# Patient Record
Sex: Female | Born: 2000
Health system: Southern US, Community
[De-identification: ages and names within clinical notes are randomized; demographics above are authoritative.]

## PROBLEM LIST (undated history)

## (undated) DIAGNOSIS — H04559 Acquired stenosis of unspecified nasolacrimal duct: Secondary | ICD-10-CM

## (undated) DIAGNOSIS — D573 Sickle-cell trait: Secondary | ICD-10-CM

## (undated) DIAGNOSIS — L309 Dermatitis, unspecified: Secondary | ICD-10-CM

## (undated) HISTORY — DX: Acquired stenosis of unspecified nasolacrimal duct: H04.559

## (undated) HISTORY — DX: Dermatitis, unspecified: L30.9

## (undated) HISTORY — DX: Sickle-cell trait: D57.3

---
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
## 2022-01-26 DIAGNOSIS — Z309 Encounter for contraceptive management, unspecified: Secondary | ICD-10-CM | POA: Diagnosis not present

## 2022-01-26 DIAGNOSIS — N898 Other specified noninflammatory disorders of vagina: Secondary | ICD-10-CM | POA: Diagnosis not present

## 2022-02-03 DIAGNOSIS — Z111 Encounter for screening for respiratory tuberculosis: Secondary | ICD-10-CM | POA: Diagnosis not present

## 2022-03-19 ENCOUNTER — Telehealth: Payer: Self-pay

## 2022-03-19 MED ORDER — EPINEPHRINE 0.3 MG/0.3ML IJ SOAJ: 0.3000 mg | INTRAMUSCULAR | 6 refills | Status: AC | PRN

## 2022-03-19 NOTE — Telephone Encounter (Signed)
EpiPen refills sent to preferred pharmacy.

## 2022-03-19 NOTE — Telephone Encounter (Signed)
Mother called and asked for a refill of her epi-pen. Called into the [facility] [address], [place], [place] [address].   Explained that it has been over a year from the last wellness check up offered to schedule an appointment. 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

## 2022-05-11 ENCOUNTER — Encounter: Payer: Self-pay | Admitting: Internal Medicine

## 2022-05-11 ENCOUNTER — Ambulatory Visit: Payer: Medicaid Other | Attending: Internal Medicine | Admitting: Internal Medicine

## 2022-05-11 VITALS — BP 120/54 | HR 77 | Ht 63.0 in | Wt 117.0 lb

## 2022-05-11 DIAGNOSIS — R002 Palpitations: Secondary | ICD-10-CM

## 2022-05-11 NOTE — Progress Notes (Signed)
Cardiology Office Note:    Date:  05/11/2022   ID:  Holly Goodman, DOB 2001-02-23, MRN [id number]  PCP:  [name], NP   [facility] Cardiologist:  Janina Mayo, MD     Referring MD: Tyson Dense, *   No chief complaint on file. Palpitations  History of Present Illness:    Holly Goodman is a 21 y.o. female with a hx of palpitations reported in 2020 at [facility], noted after smoking Marijuana. She did not require further w/u. TSH elevated 3 years ago at that time. Now normal. Notes some atypical Cp/SOB noted all day one day. Woke up with palpitations. She feels better now. No syncope.  No family hx of cardiac dx. No prior cardiac dx.  No URI.   Past Medical History:  Diagnosis Date   Lacrimal duct stenosis [id number]   Sickle cell trait ([place])     No past surgical history on file.  Current Medications: Current Meds  Medication Sig   AUROVELA 1.5/30 1.5-30 MG-MCG tablet TAKE 1 TABLET BY MOUTH DAILY. DISCARD PLACEBO TABLETS FOR CONTINUOUS CYCLING   EPINEPHrine (EPIPEN 2-PAK) 0.3 mg/0.3 mL IJ SOAJ injection Inject 0.3 mg into the muscle as needed for anaphylaxis.   fluconazole (DIFLUCAN) 150 MG tablet Take 1 tablet today and 1 tablet 3 days from now   Norethindrone Acet-Ethinyl Est (AUROVELA 1.5/30 PO) Take by mouth daily.   Vitamin D, Ergocalciferol, (DRISDOL) 1.25 MG (50000 UNIT) CAPS capsule TAKE 1 CAPSULE BY MOUTH EVERY 7 DAYS     Allergies:   Pineapple, Other, Latex, and Wool alcohol [lanolin]   Social History   Socioeconomic History   Marital status: Single    Spouse name: Not on file   Number of children: Not on file   Years of education: Not on file   Highest education level: Not on file  Occupational History   Not on file  Tobacco Use   Smoking status: Never   Smokeless tobacco: Never  Vaping Use   Vaping Use: Never used  Substance and Sexual Activity   Alcohol use: No   Drug use: No   Sexual activity: Not Currently    Birth  control/protection: Abstinence, Condom, Pill  Other Topics Concern   Not on file  Social History Narrative   12th grade [employer]      Social Determinants of Health   Financial Resource Strain: Low Risk  (08/26/2018)   Overall Financial Resource Strain (CARDIA)    Difficulty of Paying Living Expenses: Not hard at all  Food Insecurity: Unknown (08/26/2018)   Hunger Vital Sign    Worried About Running Out of Food in the Last Year: Patient refused    Ran Out of Food in the Last Year: Patient refused  Transportation Needs: Unknown (08/26/2018)   PRAPARE - Hydrologist (Medical): Patient refused    Lack of Transportation (Non-Medical): Patient refused  Physical Activity: Not on file  Stress: Not on file  Social Connections: Not on file     Family History: The patient's family history includes Cancer in her maternal grandfather; Diabetes in her maternal grandfather; Hearing loss in her maternal grandfather; Hyperlipidemia in her maternal grandmother; Hypertension in her maternal grandfather and maternal grandmother; Miscarriages / [place] in her mother; Sickle cell trait in her mother; Thyroid disease in her maternal aunt. There is no history of Alcohol abuse, Arthritis, Asthma, Birth defects, COPD, Depression, Drug abuse, Early  death, Heart disease, Kidney disease, Learning disabilities, Mental illness, Mental retardation, Stroke, Vision loss, or Varicose Veins.  ROS:   Please see the history of present illness.     All other systems reviewed and are negative.  EKGs/Labs/Other Studies Reviewed:    The following studies were reviewed today:   EKG:  EKG is  ordered today.  The ekg ordered today demonstrates   11/6- NSR with sinus arrhythmia  Recent Labs: 06/17/2021: ALT 29; BUN 8; Creat 0.86; Hemoglobin 12.8; Platelets 400; Potassium 4.2; Sodium 137; TSH 1.60  Recent Lipid Panel No results found for: "CHOL", "TRIG", "HDL",  "CHOLHDL", "VLDL", "LDLCALC", "LDLDIRECT"   Risk Assessment/Calculations:    Physical Exam:    VS:    Vitals:   05/11/22 1540  BP: (!) 120/54  Pulse: 77     Wt Readings from Last 3 Encounters:  05/11/22 117 lb (53.1 kg) (31 %, Z= -0.50)*  07/14/21 113 lb 9.6 oz (51.5 kg) (27 %, Z= -0.61)*  06/17/21 113 lb (51.3 kg) (26 %, Z= -0.64)*   * Growth percentiles are based on CDC (Girls, 2-20 Years) data.     GEN:  Well nourished, well developed in no acute distress HEENT: Normal NECK: No JVD; No carotid bruits LYMPHATICS: No lymphadenopathy CARDIAC: RRR, no murmurs, rubs, gallops RESPIRATORY:  Clear to auscultation without rales, wheezing or rhonchi  ABDOMEN: Soft, non-tender, non-distended MUSCULOSKELETAL:  No edema; No deformity  SKIN: Warm and dry NEUROLOGIC:  Alert and oriented x 3 PSYCHIATRIC:  Normal affect   ASSESSMENT:    Palpitations: She does not have high risk features including syncope c/f arrhythmia , family hx of SCD, or abnormalities on her EKG. Sinus arrhythmia is benign. Can get a TTE to r/o structural heart disease and clear for sports. Although, no signs of ecg.   PLAN:    In order of problems listed above:  TTE If normal, no further w/u           Medication Adjustments/Labs and Tests Ordered: Current medicines are reviewed at length with the patient today.  Concerns regarding medicines are outlined above.  Orders Placed This Encounter  Procedures   ECHOCARDIOGRAM COMPLETE   No orders of the defined types were placed in this encounter.   Patient Instructions  Medication Instructions:  No Changes In Medications at this time.  *If you need a refill on your cardiac medications before your next appointment, please call your pharmacy*  Lab Work: None Ordered At This Time.  If you have labs (blood work) drawn today and your tests are completely normal, you will receive your results only by: MyChart Message (if you have MyChart) OR A paper  copy in the mail If you have any lab test that is abnormal or we need to change your treatment, we will call you to review the results.  Testing/Procedures: Your physician has requested that you have an echocardiogram. Echocardiography is a painless test that uses sound waves to create images of your heart. It provides your doctor with information about the size and shape of your heart and how well your heart's chambers and valves are working. You may receive an ultrasound enhancing agent through an IV if needed to better visualize your heart during the echo.This procedure takes approximately one hour. There are no restrictions for this procedure. This will take place at the [address].   Follow-Up: At [facility], you and your health needs are our priority.  As part of  our continuing mission to provide you with exceptional heart care, we have created designated Provider Care Teams.  These Care Teams include your primary Cardiologist (physician) and Advanced Practice Providers (APPs -  Physician Assistants and Nurse Practitioners) who all work together to provide you with the care you need, when you need it.  Your next appointment:   AS NEEDED   The format for your next appointment:   In Person  Provider:   Maisie Fus, MD           Signed, Maisie Fus, MD  05/11/2022 4:06 PM    [facility]

## 2022-05-11 NOTE — Patient Instructions (Signed)
Medication Instructions:  No Changes In Medications at this time.  *If you need a refill on your cardiac medications before your next appointment, please call your pharmacy*  Lab Work: None Ordered At This Time.  If you have labs (blood work) drawn today and your tests are completely normal, you will receive your results only by: MyChart Message (if you have MyChart) OR A paper copy in the mail If you have any lab test that is abnormal or we need to change your treatment, we will call you to review the results.  Testing/Procedures: Your physician has requested that you have an echocardiogram. Echocardiography is a painless test that uses sound waves to create images of your heart. It provides your doctor with information about the size and shape of your heart and how well your heart's chambers and valves are working. You may receive an ultrasound enhancing agent through an IV if needed to better visualize your heart during the echo.This procedure takes approximately one hour. There are no restrictions for this procedure. This will take place at the [address].   Follow-Up: At [facility], you and your health needs are our priority.  As part of our continuing mission to provide you with exceptional heart care, we have created designated Provider Care Teams.  These Care Teams include your primary Cardiologist (physician) and Advanced Practice Providers (APPs -  Physician Assistants and Nurse Practitioners) who all work together to provide you with the care you need, when you need it.  Your next appointment:   AS NEEDED   The format for your next appointment:   In Person  Provider:   Branch, Mary E, MD       

## 2022-05-12 ENCOUNTER — Ambulatory Visit (HOSPITAL_COMMUNITY): Payer: Medicaid Other | Attending: Internal Medicine

## 2022-05-12 ENCOUNTER — Encounter: Payer: Self-pay | Admitting: Internal Medicine

## 2022-05-12 DIAGNOSIS — R002 Palpitations: Secondary | ICD-10-CM | POA: Diagnosis not present

## 2022-05-12 DIAGNOSIS — I340 Nonrheumatic mitral (valve) insufficiency: Secondary | ICD-10-CM

## 2022-05-12 LAB — ECHOCARDIOGRAM COMPLETE
Area-P 1/2: 3.85 cm2
MV M vel: 5.04 m/s
MV Peak grad: 101.4 mmHg
S' Lateral: 3.3 cm

## 2022-05-27 DIAGNOSIS — N760 Acute vaginitis: Secondary | ICD-10-CM | POA: Diagnosis not present

## 2022-09-04 DIAGNOSIS — F419 Anxiety disorder, unspecified: Secondary | ICD-10-CM | POA: Diagnosis not present

## 2022-09-04 DIAGNOSIS — N898 Other specified noninflammatory disorders of vagina: Secondary | ICD-10-CM | POA: Diagnosis not present

## 2022-09-04 DIAGNOSIS — L292 Pruritus vulvae: Secondary | ICD-10-CM | POA: Diagnosis not present

## 2022-09-04 DIAGNOSIS — N760 Acute vaginitis: Secondary | ICD-10-CM | POA: Diagnosis not present

## 2022-11-16 DIAGNOSIS — N898 Other specified noninflammatory disorders of vagina: Secondary | ICD-10-CM | POA: Diagnosis not present

## 2022-12-02 DIAGNOSIS — R809 Proteinuria, unspecified: Secondary | ICD-10-CM | POA: Insufficient documentation

## 2022-12-02 DIAGNOSIS — N898 Other specified noninflammatory disorders of vagina: Secondary | ICD-10-CM | POA: Diagnosis not present

## 2022-12-02 DIAGNOSIS — R82998 Other abnormal findings in urine: Secondary | ICD-10-CM | POA: Insufficient documentation

## 2022-12-02 DIAGNOSIS — R895 Abnormal microbiological findings in specimens from other organs, systems and tissues: Secondary | ICD-10-CM | POA: Diagnosis not present

## 2022-12-18 DIAGNOSIS — Z9109 Other allergy status, other than to drugs and biological substances: Secondary | ICD-10-CM | POA: Diagnosis not present

## 2022-12-18 DIAGNOSIS — A749 Chlamydial infection, unspecified: Secondary | ICD-10-CM | POA: Diagnosis not present

## 2022-12-18 DIAGNOSIS — L292 Pruritus vulvae: Secondary | ICD-10-CM | POA: Diagnosis not present

## 2022-12-31 ENCOUNTER — Ambulatory Visit (INDEPENDENT_AMBULATORY_CARE_PROVIDER_SITE_OTHER): Payer: Medicaid Other | Admitting: Pediatrics

## 2022-12-31 ENCOUNTER — Encounter: Payer: Self-pay | Admitting: Pediatrics

## 2022-12-31 VITALS — BP 98/64 | Ht 63.8 in | Wt 117.3 lb

## 2022-12-31 DIAGNOSIS — Z0001 Encounter for general adult medical examination with abnormal findings: Secondary | ICD-10-CM | POA: Diagnosis not present

## 2022-12-31 DIAGNOSIS — Z68.41 Body mass index (BMI) pediatric, 5th percentile to less than 85th percentile for age: Secondary | ICD-10-CM

## 2022-12-31 DIAGNOSIS — J351 Hypertrophy of tonsils: Secondary | ICD-10-CM

## 2022-12-31 DIAGNOSIS — Z00129 Encounter for routine child health examination without abnormal findings: Secondary | ICD-10-CM

## 2022-12-31 DIAGNOSIS — Z23 Encounter for immunization: Secondary | ICD-10-CM | POA: Diagnosis not present

## 2022-12-31 MED ORDER — MUPIROCIN 2 % EX OINT: 1.0000 | TOPICAL_OINTMENT | Freq: Two times a day (BID) | CUTANEOUS | 3 refills | Status: AC

## 2022-12-31 MED ORDER — TRIAMCINOLONE ACETONIDE 0.5 % EX CREA: 1.0000 | TOPICAL_CREAM | Freq: Three times a day (TID) | CUTANEOUS | 1 refills | Status: AC

## 2022-12-31 NOTE — Progress Notes (Signed)
Subjective:     Holly Goodman is a 22 y.o. female and is here for a comprehensive physical exam. The patient reports problems - tonsils get infected very very fast . -eczema flair -impetigo on the nose- will happen after a lot of nose blowing, will get blisters Social History   Socioeconomic History   Marital status: Single    Spouse name: Not on file   Number of children: Not on file   Years of education: Not on file   Highest education level: Not on file  Occupational History   Not on file  Tobacco Use   Smoking status: Never   Smokeless tobacco: Never  Vaping Use   Vaping Use: Never used  Substance and Sexual Activity   Alcohol use: No   Drug use: No   Sexual activity: Not Currently    Birth control/protection: Abstinence, Condom, Pill  Other Topics Concern   Not on file  Social History Narrative   12th grade [employer]      Social Determinants of Health   Financial Resource Strain: Low Risk  (08/26/2018)   Overall Financial Resource Strain (CARDIA)    Difficulty of Paying Living Expenses: Not hard at all  Food Insecurity: Unknown (08/26/2018)   Hunger Vital Sign    Worried About Running Out of Food in the Last Year: Patient declined    Ran Out of Food in the Last Year: Patient declined  Transportation Needs: Unknown (08/26/2018)   PRAPARE - Administrator, Civil Service (Medical): Patient declined    Lack of Transportation (Non-Medical): Patient declined  Physical Activity: Not on file  Stress: Not on file  Social Connections: Not on file  Intimate Partner Violence: Not on file   Health Maintenance  Topic Date Due   COVID-19 Vaccine (1) Never done   HPV VACCINES (1 - 2-dose series) Never done   Meningococcal B Vaccine  Never done   Hepatitis C Screening  Never done   INFLUENZA VACCINE  02/04/2023   DTaP/Tdap/Td (7 - Td or Tdap) 09/17/2025   HIV Screening  Completed    The following portions of the patient's history were  reviewed and updated as appropriate: allergies, current medications, past family history, past medical history, past social history, past surgical history, and problem list.  Review of Systems Pertinent items are noted in HPI.   Objective:    BP 98/64   Ht 5' 3.8" (1.621 m)   Wt 117 lb 4.8 oz (53.2 kg)   BMI 20.26 kg/m  General appearance: alert, cooperative, appears stated age, and no distress Head: Normocephalic, without obvious abnormality, atraumatic Eyes: conjunctivae/corneas clear. PERRL, EOM's intact. Fundi benign. Ears: normal TM's and external ear canals both ears Nose: Nares normal. Septum midline. Mucosa normal. No drainage or sinus tenderness. Throat: lips, mucosa, and tongue normal; teeth and gums normal and bilateral tonsils enlarged Neck: no adenopathy, no carotid bruit, no JVD, supple, symmetrical, trachea midline, and thyroid not enlarged, symmetric, no tenderness/mass/nodules Lungs: clear to auscultation bilaterally Heart: regular rate and rhythm, S1, S2 normal, no murmur, click, rub or gallop Abdomen: soft, non-tender; bowel sounds normal; no masses,  no organomegaly Extremities: extremities normal, atraumatic, no cyanosis or edema Pulses: 2+ and symmetric Skin: Skin color, texture, turgor normal. No rashes or lesions Lymph nodes: Cervical, supraclavicular, and axillary nodes normal. Neurologic: Grossly normal    Assessment:    Healthy female exam.  Tonsillar hypertrophy eczema     Plan:  Referred to ENT for tonsillar hypertrophy Triamcinolone for eczema flares Mupirocin ointment for impetigo flares MenB vaccine per orders. Indications, contraindications and side effects of vaccine/vaccines discussed with parent and parent verbally expressed understanding and also agreed with the administration of vaccine/vaccines as ordered above today.Handout (VIS) given for each vaccine at this visit. See After Visit Summary for Counseling Recommendations

## 2023-01-01 ENCOUNTER — Telehealth: Payer: Self-pay | Admitting: Pediatrics

## 2023-01-01 ENCOUNTER — Encounter: Payer: Self-pay | Admitting: Pediatrics

## 2023-01-01 DIAGNOSIS — J351 Hypertrophy of tonsils: Secondary | ICD-10-CM | POA: Insufficient documentation

## 2023-01-01 NOTE — Patient Instructions (Signed)
At [facility] we value your feedback. You may receive a survey about your visit today. Please share your experience as we strive to create trusting relationships with our patients to provide genuine, compassionate, quality care.   Preventive Care 18-21 Years Old, Female Preventive care refers to lifestyle choices and visits with your health care provider that can promote health and wellness. At this stage in your life, you may start seeing a primary care physician instead of a pediatrician for your preventive care. Preventive care visits are also called wellness exams. What can I expect for my preventive care visit? Counseling During your preventive care visit, your health care provider may ask about your: Medical history, including: Past medical problems. Family medical history. Pregnancy history. Current health, including: Menstrual cycle. Method of birth control. Emotional well-being. Home life and relationship well-being. Sexual activity and sexual health. Lifestyle, including: Alcohol, nicotine or tobacco, and drug use. Access to firearms. Diet, exercise, and sleep habits. Sunscreen use. Motor vehicle safety. Physical exam Your health care provider may check your: Height and weight. These may be used to calculate your BMI (body mass index). BMI is a measurement that tells if you are at a healthy weight. Waist circumference. This measures the distance around your waistline. This measurement also tells if you are at a healthy weight and may help predict your risk of certain diseases, such as type 2 diabetes and high blood pressure. Heart rate and blood pressure. Body temperature. Skin for abnormal spots. Breasts. What immunizations do I need? Vaccines are usually given at various ages, according to a schedule. Your health care provider will recommend vaccines for you based on your age, medical history, and lifestyle or other factors, such as travel or where you  work. What tests do I need? Screening Your health care provider may recommend screening tests for certain conditions. This may include: Vision and hearing tests. Lipid and cholesterol levels. Pelvic exam and Pap test. Hepatitis B test. Hepatitis C test. HIV (human immunodeficiency virus) test. STI (sexually transmitted infection) testing, if you are at risk. Tuberculosis skin test if you have symptoms. BRCA-related cancer screening. This may be done if you have a family history of breast, ovarian, tubal, or peritoneal cancers. Talk with your health care provider about your test results, treatment options, and if necessary, the need for more tests. Follow these instructions at home: Eating and drinking Eat a healthy diet that includes fresh fruits and vegetables, whole grains, lean protein, and low-fat dairy products. Drink enough fluid to keep your urine pale yellow. Do not drink alcohol if: Your health care provider tells you not to drink. You are pregnant, may be pregnant, or are planning to become pregnant. You are under the legal drinking age. In the [place] the legal drinking age is 21. If you drink alcohol: Limit how much you have to 0-1 drink a day. Know how much alcohol is in your drink. In the [place] one drink equals one 12 oz bottle of beer (355 mL), one 5 oz glass of wine (148 mL), or one 1 oz glass of hard liquor (44 mL). Lifestyle Brush your teeth every morning and night with fluoride toothpaste. Floss one time each day. Exercise for at least 30 minutes 5 or more days of the week. Do not use any products that contain nicotine or tobacco. These products include cigarettes, chewing tobacco, and vaping devices, such as e-cigarettes. If you need help quitting, ask your health care provider. Do not use drugs. If you are   sexually active, practice safe sex. Use a condom or other form of protection to prevent STIs. If you do not wish to become pregnant, use a form of birth control.  If you plan to become pregnant, see your health care provider for a prepregnancy visit. Find healthy ways to manage stress, such as: Meditation, yoga, or listening to music. Journaling. Talking to a trusted person. Spending time with friends and family. Safety Always wear your seat belt while driving or riding in a vehicle. Do not drive: If you have been drinking alcohol. Do not ride with someone who has been drinking. When you are tired or distracted. While texting. If you have been using any mind-altering substances or drugs. Wear a helmet and other protective equipment during sports activities. If you have firearms in your house, make sure you follow all gun safety procedures. Seek help if you have been bullied, physically abused, or sexually abused. Use the internet responsibly to avoid dangers, such as online bullying and online sex predators. What's next? Go to your health care provider once a year for an annual wellness visit. Ask your health care provider how often you should have your eyes and teeth checked. Stay up to date on all vaccines. This information is not intended to replace advice given to you by your health care provider. Make sure you discuss any questions you have with your health care provider. Document Revised: 12/18/2020 Document Reviewed: 12/18/2020 Elsevier Patient Education  2024 [employer]vier Inc.  

## 2023-01-01 NOTE — Telephone Encounter (Signed)
Patient called requesting to speak with Aron Baba, CMA, regarding a vaccination that was given 6/27. Patient is experiencing swelling on her arm and underarm and is wondering what can be done to ease the pain. Patient would like to be called back at earliest convenience.    Holly Goodman [phone]

## 2023-01-06 NOTE — Telephone Encounter (Signed)
Patient is feeling better.

## 2023-02-01 ENCOUNTER — Ambulatory Visit
Admission: EM | Admit: 2023-02-01 | Discharge: 2023-02-01 | Disposition: A | Discharge status: Home / Self Care | Payer: Medicaid Other | Attending: Physician Assistant | Admitting: Physician Assistant

## 2023-02-01 DIAGNOSIS — D573 Sickle-cell trait: Secondary | ICD-10-CM | POA: Insufficient documentation

## 2023-02-01 DIAGNOSIS — N760 Acute vaginitis: Secondary | ICD-10-CM | POA: Diagnosis not present

## 2023-02-01 NOTE — ED Triage Notes (Signed)
"  I think I have BV". Vaginal discharge and itchiness. ? Concern for STI. No dysuria.

## 2023-02-01 NOTE — ED Provider Notes (Signed)
[name]    CSN: [id number] Arrival date & time: 02/01/23  1453      History   Chief Complaint Chief Complaint  Patient presents with   Vaginal Problem    HPI Holly Goodman is a 22 y.o. female.   Patient here today for evaluation of vaginal discharge and itchiness.  She denies any genital lesions or rashes.  She would like screening for BV and STDs.  She denies any abdominal pain, nausea, vomiting or back pain.  She has not had fever.  She denies any known STD exposure.  She declines blood work for STD screening.  The history is provided by the patient.    Past Medical History:  Diagnosis Date   Lacrimal duct stenosis [id number]   Mild persistent asthma with acute exacerbation 07/07/2012   Exacerbation off and on from 06/21/12 through 07/07/12   Sickle cell trait [facility])     Patient Active Problem List   Diagnosis Date Noted   Tonsillar hypertrophy 01/01/2023   Proteinuria 12/02/2022   Leukocytes in urine 12/02/2022   Encounter for well child check without abnormal findings 04/12/2017   BMI (body mass index), pediatric, 5% to less than 85% for age 40/02/2017    History reviewed. No pertinent surgical history.  OB History   No obstetric history on file.      Home Medications    Prior to Admission medications   Medication Sig Start Date End Date Taking? Authorizing Provider  norethindrone-ethinyl estradiol-iron (JUNEL FE 1.5/30) 1.5-30 MG-MCG tablet Take 1 tablet by mouth daily. 01/11/23  Yes [provider]  AUROVELA 1.5/30 1.5-30 MG-MCG tablet TAKE 1 TABLET BY MOUTH DAILY. DISCARD PLACEBO TABLETS FOR CONTINUOUS CYCLING 02/03/21   Alfonso Ramus T, FNP  clobetasol ointment (TEMOVATE) 0.05 % Apply 1 Application topically 2 (two) times daily.    [provider]  EPINEPHrine (EPIPEN 2-PAK) 0.3 mg/0.3 mL IJ SOAJ injection Inject 0.3 mg into the muscle as needed for anaphylaxis. 03/19/22   Klett, Pascal Lux, NP  JUNEL FE 1.5/30 1.5-30 MG-MCG  tablet Take 1 tablet by mouth daily. 10/20/22   [provider]  norethindrone-ethinyl estradiol-iron (HAILEY FE 1.5/30) 1.5-30 MG-MCG tablet Take 1 tablet by mouth daily.    [provider]  norethindrone-ethinyl estradiol-iron (JUNEL FE 1.5/30) 1.5-30 MG-MCG tablet Take 1 tablet by mouth daily.    [provider]  sertraline (ZOLOFT) 25 MG tablet Take 1 tablet by mouth daily.    [provider]    Family History Family History  Problem Relation Age of Onset   Hypertension Maternal Grandmother    Hyperlipidemia Maternal Grandmother    Diabetes Maternal Grandfather    Hypertension Maternal Grandfather    Cancer Maternal Grandfather        prostate   Hearing loss Maternal Freight forwarder exposure   Sickle cell trait Mother    Miscarriages / [place] Mother    Thyroid disease Maternal Aunt    Alcohol abuse Neg Hx    Arthritis Neg Hx    Asthma Neg Hx    Birth defects Neg Hx    COPD Neg Hx    Depression Neg Hx    Drug abuse Neg Hx    Early death Neg Hx    Heart disease Neg Hx    Kidney disease Neg Hx    Learning disabilities Neg Hx    Mental illness Neg Hx    Mental retardation Neg Hx  Stroke Neg Hx    Vision loss Neg Hx    Varicose Veins Neg Hx     Social History Social History   Tobacco Use   Smoking status: Never   Smokeless tobacco: Never  Vaping Use   Vaping status: Never Used  Substance Use Topics   Alcohol use: No   Drug use: No     Allergies   Pineapple, Other, Latex, and Wool alcohol [lanolin]   Review of Systems Review of Systems  Constitutional:  Negative for chills and fever.  Eyes:  Negative for discharge and redness.  Respiratory:  Negative for shortness of breath.   Gastrointestinal:  Negative for abdominal pain, nausea and vomiting.  Genitourinary:  Positive for vaginal discharge. Negative for flank pain and genital sores.  Musculoskeletal:  Negative for back pain.     Physical  Exam Triage Vital Signs ED Triage Vitals  Encounter Vitals Group     BP 02/01/23 1503 114/79     Systolic BP Percentile --      Diastolic BP Percentile --      Pulse Rate 02/01/23 1503 88     Resp 02/01/23 1503 16     Temp 02/01/23 1503 98.9 F (37.2 C)     Temp Source 02/01/23 1503 Oral     SpO2 02/01/23 1503 99 %     Weight 02/01/23 1501 119 lb (54 kg)     Height 02/01/23 1501 5\' 3"  (1.6 m)     Head Circumference --      Peak Flow --      Pain Score 02/01/23 1501 0     Pain Loc --      Pain Education --      Exclude from Growth Chart --    No data found.  Updated Vital Signs BP 114/79 (BP Location: Left Arm)   Pulse 88   Temp 98.9 F (37.2 C) (Oral)   Resp 16   Ht 5\' 3"  (1.6 m)   Wt 119 lb (54 kg)   SpO2 99%   BMI 21.08 kg/m      Physical Exam Vitals and nursing note reviewed.  Constitutional:      General: She is not in acute distress.    Appearance: Normal appearance. She is not ill-appearing.  HENT:     Head: Normocephalic and atraumatic.  Eyes:     Conjunctiva/sclera: Conjunctivae normal.  Cardiovascular:     Rate and Rhythm: Normal rate.  Pulmonary:     Effort: Pulmonary effort is normal. No respiratory distress.  Genitourinary:    Comments: Patient performed self swab Neurological:     Mental Status: She is alert.  Psychiatric:        Mood and Affect: Mood normal.        Behavior: Behavior normal.        Thought Content: Thought content normal.      UC Treatments / Results  Labs (all labs ordered are listed, but only abnormal results are displayed) Labs Reviewed  CERVICOVAGINAL ANCILLARY ONLY    EKG   Radiology No results found.  Procedures Procedures (including critical care time)  Medications Ordered in UC Medications - No data to display  Initial Impression / Assessment and Plan / UC Course  I have reviewed the triage vital signs and the nursing notes.  Pertinent labs & imaging results that were available during my care of  the patient were reviewed by me and considered in my medical decision making (see chart for  details).    STD screening ordered as well as screening for BV and yeast.  Will await results further recommendation.  Encouraged follow-up if symptoms worsen in the interim with any further concerns.  Final Clinical Impressions(s) / UC Diagnoses   Final diagnoses:  Acute vaginitis   Discharge Instructions   None    ED Prescriptions   None    PDMP not reviewed this encounter.   Tomi Bamberger, PA-C 02/01/23 [phone]

## 2023-02-02 ENCOUNTER — Encounter: Payer: Self-pay | Admitting: Internal Medicine

## 2023-02-02 ENCOUNTER — Other Ambulatory Visit: Payer: Self-pay

## 2023-02-02 ENCOUNTER — Ambulatory Visit (INDEPENDENT_AMBULATORY_CARE_PROVIDER_SITE_OTHER): Payer: Medicaid Other | Admitting: Internal Medicine

## 2023-02-02 VITALS — BP 104/68 | HR 74 | Temp 98.1°F | Ht 63.78 in | Wt 120.3 lb

## 2023-02-02 DIAGNOSIS — L501 Idiopathic urticaria: Secondary | ICD-10-CM | POA: Diagnosis not present

## 2023-02-02 DIAGNOSIS — J3089 Other allergic rhinitis: Secondary | ICD-10-CM

## 2023-02-02 DIAGNOSIS — J3081 Allergic rhinitis due to animal (cat) (dog) hair and dander: Secondary | ICD-10-CM

## 2023-02-02 DIAGNOSIS — L272 Dermatitis due to ingested food: Secondary | ICD-10-CM

## 2023-02-02 DIAGNOSIS — J301 Allergic rhinitis due to pollen: Secondary | ICD-10-CM

## 2023-02-02 DIAGNOSIS — T7840XD Allergy, unspecified, subsequent encounter: Secondary | ICD-10-CM | POA: Diagnosis not present

## 2023-02-02 MED ORDER — CETIRIZINE HCL 10 MG PO TABS: 10.0000 mg | ORAL_TABLET | Freq: Two times a day (BID) | ORAL | 5 refills | Status: DC

## 2023-02-02 MED ORDER — HYDROCORTISONE 2.5 % EX CREA: TOPICAL_CREAM | Freq: Two times a day (BID) | CUTANEOUS | 5 refills | Status: DC

## 2023-02-02 MED ORDER — TRIAMCINOLONE ACETONIDE 0.1 % EX OINT: TOPICAL_OINTMENT | CUTANEOUS | 5 refills | Status: AC

## 2023-02-02 MED ORDER — AZELASTINE HCL 0.1 % NA SOLN: 1.0000 | Freq: Two times a day (BID) | NASAL | 5 refills | Status: DC | PRN

## 2023-02-02 NOTE — Progress Notes (Signed)
NEW PATIENT  Date of Service/Encounter:  02/02/23  Consult requested by: Holly June, NP   Subjective:   Holly Goodman (DOB: 12/07/2000) is a 22 y.o. female who presents to the clinic on 02/02/2023 with a chief complaint of Allergies, Eczema, and Establish Care .    History obtained from: chart review and patient.   Food Reactions: Reports the past several years, has had reactions to fried foods and wonders if it is from oils.   Last year, she had a bad reaction when eating popcorn and then later broke out in hives/rashes with itchy eyes and chest tightness.  Resolved without ER visit. Another reaction with korean fried chicken with chest tightness and itching. Most recently was at a [place] with similar symptoms of chest tightness and itching.  Does eat red meat.    History of Wheezing: Reports requiring albuterol once with RSV bronchiolitis around age 62 and then never after that. She runs track and field and does not have any SOB/wheezing/coughing with activity.   Rhinitis:  Started in childhood. Symptoms include: nasal congestion, rhinorrhea, post nasal drainage, sneezing, and itchy eyes  Occurs seasonally-Spring Potential triggers: pollen Treatments tried:  Zyrtec or [name] PRN; last use was in May  Previous allergy testing: no History of reflux/heartburn: none History of sinus surgery: no Nonallergic triggers: none   Atopic Dermatitis:  Diagnosed in infancy and then it improved but in freshmen year of highschool, it reoccurs.   Areas that flare commonly are behind the knees, wrists, inner thigh. Current regimen: shea butter lotion, triamcinolone PRN but not too frequent  Reports use of fragrance/dye products Identified triggers of flares include seasonal in Spring Sleep is not affected   Past Medical History: Past Medical History:  Diagnosis Date   Eczema    Lacrimal duct stenosis [id number]   Mild persistent asthma with acute exacerbation  07/07/2012   Exacerbation off and on from 06/21/12 through 07/07/12   Sickle cell trait Holly Goodman)     Past Surgical History: History reviewed. No pertinent surgical history.  Family History: Family History  Problem Relation Age of Onset   Allergic rhinitis Mother    Sickle cell trait Mother    Miscarriages / [place] Mother    Thyroid disease Maternal Aunt    Hypertension Maternal Grandmother    Hyperlipidemia Maternal Grandmother    Diabetes Maternal Grandfather    Hypertension Maternal Grandfather    Cancer Maternal Grandfather        prostate   Hearing loss Maternal Freight forwarder exposure   Alcohol abuse Neg Hx    Arthritis Neg Hx    Asthma Neg Hx    Birth defects Neg Hx    COPD Neg Hx    Depression Neg Hx    Drug abuse Neg Hx    Early death Neg Hx    Heart disease Neg Hx    Kidney disease Neg Hx    Learning disabilities Neg Hx    Mental illness Neg Hx    Mental retardation Neg Hx    Stroke Neg Hx    Vision loss Neg Hx    Varicose Veins Neg Hx     Social History:  Flooring in bedroom: Engineer, civil (consulting) Pets: none Tobacco use/exposure: none Job: cleaning and taking orders   Medication List:  Allergies as of 02/02/2023       Reactions   Pineapple Anaphylaxis   Other Hives, Swelling, Rash   Canoa Oil  Latex Itching   Other Reaction(s): Not available   Wool Alcohol [lanolin] Itching        Medication List        Accurate as of February 02, 2023 12:04 PM. If you have any questions, ask your nurse or doctor.          Aurovela 1.5/30 1.5-30 MG-MCG tablet Generic drug: Norethindrone Acetate-Ethinyl Estradiol TAKE 1 TABLET BY MOUTH DAILY. DISCARD PLACEBO TABLETS FOR CONTINUOUS CYCLING   clobetasol ointment 0.05 % Commonly known as: TEMOVATE Apply 1 Application topically 2 (two) times daily.   EPINEPHrine 0.3 mg/0.3 mL Soaj injection Commonly known as: EpiPen 2-Pak Inject 0.3 mg into the muscle as needed for anaphylaxis.   Hailey FE 1.5/30  1.5-30 MG-MCG tablet Generic drug: norethindrone-ethinyl estradiol-iron Take 1 tablet by mouth daily.   Junel FE 1.5/30 1.5-30 MG-MCG tablet Generic drug: norethindrone-ethinyl estradiol-iron Take 1 tablet by mouth daily.   Junel FE 1.5/30 1.5-30 MG-MCG tablet Generic drug: norethindrone-ethinyl estradiol-iron Take 1 tablet by mouth daily.   Junel FE 1.5/30 1.5-30 MG-MCG tablet Generic drug: norethindrone-ethinyl estradiol-iron Take 1 tablet by mouth daily.   sertraline 25 MG tablet Commonly known as: ZOLOFT Take 1 tablet by mouth daily.         REVIEW OF SYSTEMS: Pertinent positives and negatives discussed in HPI.   Objective:   Physical Exam: BP 104/68   Pulse 74   Temp 98.1 F (36.7 C) (Temporal)   Ht 5' 3.78" (1.62 m)   Wt 120 lb 4.8 oz (54.6 kg)   SpO2 100%   BMI 20.79 kg/m  Body mass index is 20.79 kg/m. GEN: alert, well developed HEENT: clear conjunctiva, TM grey and translucent, nose with + inferior turbinate hypertrophy, pink nasal mucosa, slight clear rhinorrhea, no cobblestoning HEART: regular rate and rhythm, no murmur LUNGS: clear to auscultation bilaterally, no coughing, unlabored respiration ABDOMEN: soft, non distended  SKIN: no rashes or lesions  Reviewed:  12/31/2022: seen by Holly Harvey NP for well child visit, tonsillar hypertrophy and eczema.  On triamcinolone PRN. Also given mupirocin for impetigo. Referred to ENT for tonsillar hypertrophy.   04/20/2022: seen in ED for palpitations/chest tightness.  Took a Claritin in case it was related to her allergies.  Seen by cardiology in the past who ruled out structural causes.   12/26/2018: seen by [facility] Cardiology for palpitations, dizziness. Discussed likely benign palpitations.  No further workup at this time.  Discussed eating healthy diet, increased fluid intake and regular exercise.   Skin Testing:  Skin prick testing was placed, which includes aeroallergens/foods, histamine control, and saline  control.  Verbal consent was obtained prior to placing test.  Patient tolerated procedure well.  Allergy testing results were read and interpreted by myself, documented by clinical staff. Adequate positive and negative control.  Results discussed with patient/family.  Airborne Adult Perc - 02/02/23 1011     Time Antigen Placed 1011    Allergen Manufacturer Waynette Buttery    Location Back    Number of Test 55    1. Control-Buffer 50% Glycerol Negative    2. Control-Histamine 3+    3. Bahia 3+    4. French Southern Territories Negative    5. Johnson Negative    6. Kentucky Blue 3+    7. Meadow Fescue 3+    8. Perennial Rye 3+    9. Timothy 3+    10. Ragweed Mix Negative    11. Cocklebur 2+    12. Plantain,  English 2+  13. Baccharis 2+    14. Dog Fennel Negative    15. Russian Thistle Negative    16. Lamb's Quarters 2+    17. Sheep Sorrell 2+    18. Rough Pigweed 2+    19. Marsh Elder, Rough Negative    20. Mugwort, Common 2+    21. Box, Elder 3+    22. Cedar, red 3+    23. Sweet Gum 3+    24. Pecan Pollen 3+    25. Pine Mix Negative    26. Walnut, Black Pollen 3+    27. Red Mulberry 3+    28. Ash Mix 3+    29. Birch Mix 3+    30. Beech American 3+    31. Cottonwood, Eastern 3+    32. Hickory, White 3+    33. Maple Mix 3+    34. Oak, Guinea-Bissau Mix 3+    35. Sycamore Eastern Negative    36. Alternaria Alternata Negative    37. Cladosporium Herbarum 3+    38. Aspergillus Mix Negative    39. Penicillium Mix 3+    40. Bipolaris Sorokiniana (Helminthosporium) 3+    41. Drechslera Spicifera (Curvularia) Negative    42. Mucor Plumbeus 2+    43. Fusarium Moniliforme Negative    44. Aureobasidium Pullulans (pullulara) Negative    45. Rhizopus Oryzae Negative    46. Botrytis Cinera Negative    47. Epicoccum Nigrum 3+    48. Phoma Betae Negative    49. Dust Mite Mix Negative    50. Cat Hair 10,000 BAU/ml Negative    51.  Dog Epithelia Negative    52. Mixed Feathers Negative    53. Horse Epithelia  2+    54. Cockroach, German Negative    55. Tobacco Leaf 2+             13 Food Perc - 02/02/23 1025       Test Information   Time Antigen Placed 1011    Allergen Manufacturer Greer    Location Back    Number of allergen test 13      Food   1. Peanut Negative    2. Soybean Negative    3. Wheat Negative    4. Sesame Negative    5. Milk, Cow Negative    6. Casein Negative    7. Egg White, Chicken Negative    8. Shellfish Mix Negative    9. Fish Mix Negative    10. Cashew Negative    11. Walnut Food Negative    12. Almond Negative    13. Hazelnut Negative               Assessment:   1. Idiopathic urticaria   2. Allergic reaction, subsequent encounter   3. Seasonal allergic rhinitis due to pollen   4. Allergic rhinitis caused by mold   5. Allergic rhinitis due to animal hair or dander     Plan/Recommendations:  Eczema: - Do a daily soaking tub bath in warm water for 10-15 minutes.  - Use a gentle, unscented cleanser at the end of the bath (such as Dove unscented bar or baby wash, or Aveeno sensitive body wash). Then rinse, pat half-way dry, and apply a gentle, unscented moisturizer cream or ointment (Cerave, Cetaphil, Eucerin, Aveeno)  all over while still damp. Dry skin makes the itching and rash of eczema worse. The skin should be moisturized with a gentle, unscented moisturizer at least twice daily.  - Use  only unscented liquid laundry detergent. - Apply prescribed topical steroid (triamcinolone 0.1% below neck or hydrocortisone 2.5% above neck) to flared areas (red and thickened eczema) after the moisturizer has soaked into the skin (wait at least 30 minutes). Taper off the topical steroids as the skin improves. Do not use topical steroid for more than 7-10 days at a time.   Allergic Rhinitis: - Due to turbinate hypertrophy, seasonal symptoms and unresponsive to over the counter meds, performed skin testing to identify aeroallergen triggers.   - Positive  skin test 01/2023: trees, grasses, weeds, molds, horses, tobacco leaf  - Avoidance measures discussed. - Use nasal saline rinses before nose sprays such as with Neilmed Sinus Rinse.  Use distilled water.   - Use Azelastine 1-2 sprays each nostril twice daily as needed for runny nose, drainage, sneezing, congestion. Aim upward and outward. - Use Zyrtec 10 mg daily or [name] 180mg  daily as needed.  - Consider allergy shots as long term control of your symptoms by teaching your immune system to be more tolerant of your allergy triggers  Allergic Reactions Idiopathic Urticaria - Unclear etiology.  Reactions with chest tightness and itching and rash. Discussed possibility of idiopathic urticaria but that would not explain the chest tightness; also has been seen by Cardiology/ER for palpitations/chest tightness without a clear etiology.  She is worried about oils causing the reaction but we discussed that most oils used in fast food are highly processed and do not have any allergenic protein; furthermore, there is no testing available for oils.  - I want you to cut out MSG from your diet as that can present with pseudoallergy symptoms.  - Keep diary of future reactions with foods ingested, timeline, symptoms, treatments used. - Will obtain bloodwork also to check alpha gal and tryptase.  - In the meantime, take Zyrtec 10mg  daily.  If rash recurs despite that, increase to Zyrtec 10mg  twice daily.       Return in about 6 weeks (around 03/16/2023).  Alesia Morin, MD [facility]

## 2023-02-02 NOTE — Patient Instructions (Addendum)
Eczema: - Do a daily soaking tub bath in warm water for 10-15 minutes.  - Use a gentle, unscented cleanser at the end of the bath (such as Dove unscented bar or baby wash, or Aveeno sensitive body wash). Then rinse, pat half-way dry, and apply a gentle, unscented moisturizer cream or ointment (Cerave, Cetaphil, Eucerin, Aveeno)  all over while still damp. Dry skin makes the itching and rash of eczema worse. The skin should be moisturized with a gentle, unscented moisturizer at least twice daily.  - Use only unscented liquid laundry detergent. - Apply prescribed topical steroid (triamcinolone 0.1% below neck or hydrocortisone 2.5% above neck) to flared areas (red and thickened eczema) after the moisturizer has soaked into the skin (wait at least 30 minutes). Taper off the topical steroids as the skin improves. Do not use topical steroid for more than 7-10 days at a time.   Allergic Rhinitis: - Positive skin test 01/2023: trees, grasses, weeds, molds, horses, tobacco leaf  - Avoidance measures discussed. - Use nasal saline rinses before nose sprays such as with Neilmed Sinus Rinse.  Use distilled water.   - Use Azelastine 1-2 sprays each nostril twice daily as needed for runny nose, drainage, sneezing, congestion. Aim upward and outward. - Use Zyrtec 10 mg daily or [name] 180mg  daily as needed.  - Consider allergy shots as long term control of your symptoms by teaching your immune system to be more tolerant of your allergy triggers  Allergy Reactions - I want you to cut out MSG from your diet. It can cause allergy type of symptoms.  - Keep diary of future reactions with foods ingested, timeline, symptoms, treatments used. - Will obtain bloodwork also.  - In the meantime, take Zyrtec 10mg  daily.  If rash/reactions recur despite that, increase to Zyrtec 10mg  twice daily.    ALLERGEN AVOIDANCE MEASURES   Molds - Indoor avoidance Use air conditioning to reduce indoor humidity.  Do not use a  humidifier. Keep indoor humidity at 30 - 40%.  Use a dehumidifier if needed. In the bathroom use an exhaust fan or open a window after showering.  Wipe down damp surfaces after showering.  Clean bathrooms with a mold-killing solution (diluted bleach, or products like Tilex, etc) at least once a month. In the kitchen use an exhaust fan to remove steam from cooking.  Throw away spoiled foods immediately, and empty garbage daily.  Empty water pans below self-defrosting refrigerators frequently. Vent the clothes dryer to the outside. Limit indoor houseplants; mold grows in the dirt.  No houseplants in the bedroom. Remove carpet from the bedroom. Encase the mattress and box springs with a zippered encasing.  Molds - Outdoor avoidance Avoid being outside when the grass is being mowed, or the ground is tilled. Avoid playing in leaves, pine straw, hay, etc.  Dead plant materials contain mold. Avoid going into barns or grain storage areas. Remove leaves, clippings and compost from around the home.  Pollen Avoidance Pollen levels are highest during the mid-day and afternoon.  Consider this when planning outdoor activities. Avoid being outside when the grass is being mowed, or wear a mask if the pollen-allergic person must be the one to mow the grass. Keep the windows closed to keep pollen outside of the home. Use an air conditioner to filter the air. Take a shower, wash hair, and change clothing after working or playing outdoors during pollen season.

## 2023-02-09 DIAGNOSIS — Z113 Encounter for screening for infections with a predominantly sexual mode of transmission: Secondary | ICD-10-CM | POA: Diagnosis not present

## 2023-02-09 DIAGNOSIS — Z309 Encounter for contraceptive management, unspecified: Secondary | ICD-10-CM | POA: Diagnosis not present

## 2023-02-09 DIAGNOSIS — Z0000 Encounter for general adult medical examination without abnormal findings: Secondary | ICD-10-CM | POA: Diagnosis not present

## 2023-02-09 DIAGNOSIS — Z6820 Body mass index (BMI) 20.0-20.9, adult: Secondary | ICD-10-CM | POA: Diagnosis not present

## 2023-02-09 DIAGNOSIS — Z8619 Personal history of other infectious and parasitic diseases: Secondary | ICD-10-CM | POA: Diagnosis not present

## 2023-02-09 DIAGNOSIS — Z01419 Encounter for gynecological examination (general) (routine) without abnormal findings: Secondary | ICD-10-CM | POA: Diagnosis not present

## 2023-02-16 ENCOUNTER — Ambulatory Visit (INDEPENDENT_AMBULATORY_CARE_PROVIDER_SITE_OTHER): Payer: Medicaid Other | Admitting: Clinical

## 2023-02-16 ENCOUNTER — Encounter (HOSPITAL_COMMUNITY): Payer: Self-pay

## 2023-02-16 DIAGNOSIS — F410 Panic disorder [episodic paroxysmal anxiety] without agoraphobia: Secondary | ICD-10-CM

## 2023-02-16 DIAGNOSIS — F411 Generalized anxiety disorder: Secondary | ICD-10-CM | POA: Diagnosis not present

## 2023-02-16 NOTE — Progress Notes (Signed)
Comprehensive Clinical Assessment (CCA) Note  02/16/2023 Holly Goodman [id number]  Virtual Visit via Video Note  I connected with Holly Goodman on 02/16/23 at  8:00 AM EDT by a video enabled telemedicine application and verified that I am speaking with the correct person using two identifiers.  Location: Patient: parked car Provider: office   I discussed the limitations of evaluation and management by telemedicine and the availability of in person appointments. The patient expressed understanding and agreed to proceed.   Follow Up Instructions: I discussed the assessment and treatment plan with the patient. The patient was provided an opportunity to ask questions and all were answered. The patient agreed with the plan and demonstrated an understanding of the instructions.   The patient was advised to call back or seek an in-person evaluation if the symptoms worsen or if the condition fails to improve as anticipated.  I provided 30 minutes of non-face-to-face time during this encounter.   Loree Fee, LCSW   Chief Complaint:  Chief Complaint  Patient presents with   Panic Attack   Anxiety   Visit Diagnosis:   Generalized anxiety disorder with panic attacks  Interpretive summary:  Client is a 22 year old female presenting to the [facility] for follow-up outpatient services.  Client reported she is referred by her primary care physician at [facility] pediatrics for a clinical assessment.  Client reported over the past 4 years she has had symptoms of feeling overwhelmed but over time has learned that it is related to anxiety.  Client reported in 2020 she was having severe heart palpitations and difficulty breathing so she reported to the hospital.  Client reported it was determined that the cause of her physical symptoms was related to anxiety.  Client reported during that time she believes she was under stress with attending school during the pandemic  and also watching after her sisters 73-month-old baby. Client reported over time she has had reoccurring symptoms of feeling on edge, easily overwhelmed, difficulty concentrating, and insomnia.  Client reported she especially has difficulty at night and she is trying to fall asleep because that is when everything races through her mind.  Client reported some nights she is not able to fall asleep until 6 AM.  Client reported her primary care physician prescribed her Zoloft approximately the end of 2023.  Client reported a few months ago she was evaluated by a cardiologist to further investigate her palpitations.  Client reported it was determined that she has a leaking valve and they want her to go back in 3 years for another checkup. Client reported the palpitations do cause her physical pain. Client reported her primary care physician instructed her although it is uncomfortable it is not of major concern that would require procedure and/or medication at this time. Client reported she was encouraged by her primary care physician to engage in therapeutic techniques to help keep her relaxed. Client reported there is no immediate family history of clinical mental illness but further down the family She has cousins who are clinically diagnosed with depression and schizophrenia.  Client denied history of illicit substance use.  Client denied any other history of inpatient and/or outpatient treatment related to mental health concerns. Client presented to the appointment oriented x 5, appropriately dressed, and friendly.  Client denied hallucinations, delusions, suicidal and homicidal ideations.  Client was screened for pain, nutrition, [place] suicide severity and the following SDOH:    02/16/2023    8:38 AM  GAD 7 :  Generalized Anxiety Score  Nervous, Anxious, on Edge 3  Control/stop worrying 3  Worry too much - different things 3  Trouble relaxing 3  Restless 0  Easily annoyed or irritable 2  Afraid - awful  might happen 3  Total GAD 7 Score 17  Anxiety Difficulty Very difficult   Treatment recommendations: Psychiatry with medication management.  Client declined individual counseling at this time.  Therapist provided information on format of appointment (virtual or face to face).   The client was advised to call back or seek an in-person evaluation if the symptoms worsen or if the condition fails to improve as anticipated before the next scheduled appointment. Client was in agreement with treatment recommendations.   CCA Biopsychosocial Intake/Chief Complaint:  Client is presenting by referral of her primary care physician due to reported symptoms of reoccurring anxiety that has been ongoing for approximately 4 years.  Current Symptoms/Problems: Client reported feeling overwhelmed, feeling on edge, shortness of breath, racing thoughts, insomnia  Patient Reported Schizophrenia/Schizoaffective Diagnosis in Past: No  Strengths: Voluntarily engaging in follow-up outpatient mental health services  Preferences: Psychiatry for medication management  Abilities: Vocalize problems and needs  Type of Services Patient Feels are Needed: Psychiatry  Initial Clinical Notes/Concerns: No data recorded  Mental Health Symptoms Depression:   None   Duration of Depressive symptoms: No data recorded  Mania:  No data recorded  Anxiety:    Difficulty concentrating; Irritability; Restlessness; Sleep; Tension; Worrying   Psychosis:   None   Duration of Psychotic symptoms: No data recorded  Trauma:   None   Obsessions:   None   Compulsions:   None   Inattention:   None   Hyperactivity/Impulsivity:   None   Oppositional/Defiant Behaviors:   None   Emotional Irregularity:   None   Other Mood/Personality Symptoms:  No data recorded   Mental Status Exam Appearance and self-care  Stature:   Average   Weight:   Average weight   Clothing:   Casual   Grooming:   Normal    Cosmetic use:   Age appropriate   Posture/gait:   Normal   Motor activity:   Not Remarkable   Sensorium  Attention:   Normal   Concentration:   Normal   Orientation:   X5   Recall/memory:   Normal   Affect and Mood  Affect:   Congruent   Mood:   Euthymic   Relating  Eye contact:   Normal   Facial expression:   Responsive   Attitude toward examiner:   Cooperative   Thought and Language  Speech flow:  Clear and Coherent   Thought content:   Appropriate to Mood and Circumstances   Preoccupation:   None   Hallucinations:   None   Organization:  No data recorded  [employer] of Knowledge:   Good   Intelligence:   Average   Abstraction:   Normal   Judgement:   Good   Reality Testing:   Adequate   Insight:   Good   Decision Making:   Normal   Social Functioning  Social Maturity:   Responsible   Social Judgement:   Normal   Stress  Stressors:   Transitions   Coping Ability:   Normal   Skill Deficits:   Activities of daily living; Self-care   Supports:   Family     Religion: Religion/Spirituality Are You A Religious Person?: No  Leisure/Recreation: Leisure / Recreation Do You Have Hobbies?: Yes  Exercise/Diet: Exercise/Diet Do You Exercise?: Yes What Type of Exercise Do You Do?: Run/Walk How Many Times a Week Do You Exercise?: 1-3 times a week Have You Gained or Lost A Significant Amount of Weight in the Past Six Months?: No Do You Follow a Special Diet?: No Do You Have Any Trouble Sleeping?: Yes Explanation of Sleeping Difficulties: Client reported difficulty falling and staying asleep.  Client reported some days she is not able to fall asleep until 6 in the morning.   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  Education: Education Did Garment/textile technologist From [employer]?: Yes Did Theme park manager?: Yes What Type of  College Degree Do you Have?: currently in [employer], studying psychology Did You Have An Individualized Education Program (IIEP): No Did You Have Any Difficulty At [employer]?: No Patient's Education Has Been Impacted by Current Illness: Yes   CCA Family/Childhood History Family and Relationship History: Family history Marital status: Single Does patient have children?: No  Childhood History:  Childhood History Additional childhood history information: client reported she is from [place] and raised by her mother. client reported she had a good childhood.  Client reported she has 5 sisters but grew up with 2 of them in the household with her.  Client reported when she was born her sisters were already teenagers so they were able to do a lot of things to take her around up until this time when they went off to live on her own. Patient's description of current relationship with people who raised him/her: client reported she has a good relationship with her mother. Does patient have siblings?: Yes Number of Siblings: 5 Description of patient's current relationship with siblings: grew up with 2 of her sisters Did patient suffer any verbal/emotional/physical/sexual abuse as a child?: No Did patient suffer from severe childhood neglect?: No Has patient ever been sexually abused/assaulted/raped as an adolescent or adult?: No Was the patient ever a victim of a crime or a disaster?: No Witnessed domestic violence?: No Has patient been affected by domestic violence as an adult?: No  Child/Adolescent Assessment:     CCA Substance Use Alcohol/Drug Use: Alcohol / Drug Use History of alcohol / drug use?: No history of alcohol / drug abuse                         ASAM's:  Six Dimensions of Multidimensional Assessment  Dimension 1:  Acute Intoxication and/or Withdrawal Potential:      Dimension 2:  Biomedical Conditions and Complications:      Dimension 3:   Emotional, Behavioral, or Cognitive Conditions and Complications:     Dimension 4:  Readiness to Change:     Dimension 5:  Relapse, Continued use, or Continued Problem Potential:     Dimension 6:  Recovery/Living Environment:     ASAM Severity Score:    ASAM Recommended Level of Treatment:     Substance use Disorder (SUD)    Recommendations for Services/Supports/Treatments: Recommendations for Services/Supports/Treatments Recommendations For Services/Supports/Treatments: Medication Management  DSM5 Diagnoses: Patient Active Problem List   Diagnosis Date Noted   Tonsillar hypertrophy 01/01/2023   Proteinuria 12/02/2022   Leukocytes in urine 12/02/2022   Encounter for well child check without abnormal findings 04/12/2017   BMI (body mass index), pediatric, 5% to less than 85% for age 71/02/2017    Patient Centered Plan: Patient is on the following Treatment Plan(s):  Anxiety   Referrals to  Alternative Service(s): Referred to Alternative Service(s):   Place:   Date:   Time:    Referred to Alternative Service(s):   Place:   Date:   Time:    Referred to Alternative Service(s):   Place:   Date:   Time:    Referred to Alternative Service(s):   Place:   Date:   Time:      Collaboration of Care: Medication Management [facility]   Patient/Guardian was advised Release of Information must be obtained prior to any record release in order to collaborate their care with an outside provider. Patient/Guardian was advised if they have not already done so to contact the registration department to sign all necessary forms in order for [place] to release information regarding their care.   Consent: Patient/Guardian gives verbal consent for treatment and assignment of benefits for services provided during this visit. Patient/Guardian expressed understanding and agreed to proceed.   Neena Rhymes Lolly Glaus, LCSW

## 2023-02-17 ENCOUNTER — Ambulatory Visit (INDEPENDENT_AMBULATORY_CARE_PROVIDER_SITE_OTHER): Payer: Medicaid Other | Admitting: Student

## 2023-02-17 VITALS — BP 122/82 | HR 80 | Ht 63.0 in | Wt 119.6 lb

## 2023-02-17 DIAGNOSIS — F410 Panic disorder [episodic paroxysmal anxiety] without agoraphobia: Secondary | ICD-10-CM | POA: Diagnosis not present

## 2023-02-17 DIAGNOSIS — F411 Generalized anxiety disorder: Secondary | ICD-10-CM

## 2023-02-17 DIAGNOSIS — F5104 Psychophysiologic insomnia: Secondary | ICD-10-CM

## 2023-02-17 DIAGNOSIS — F4010 Social phobia, unspecified: Secondary | ICD-10-CM

## 2023-02-17 DIAGNOSIS — E559 Vitamin D deficiency, unspecified: Secondary | ICD-10-CM

## 2023-02-17 MED ORDER — SERTRALINE HCL 25 MG PO TABS: 25.0000 mg | ORAL_TABLET | Freq: Every day | ORAL | 1 refills | Status: DC

## 2023-02-17 MED ORDER — TRAZODONE HCL 50 MG PO TABS: ORAL_TABLET | ORAL | 1 refills | Status: DC
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
## 2023-02-19 ENCOUNTER — Telehealth (HOSPITAL_COMMUNITY): Payer: Self-pay | Admitting: Student

## 2023-02-19 NOTE — Telephone Encounter (Signed)
As discussed with the patient at her visit on 8/14, I filled out the accommodation forms and faxed them to the [facility] office listed on the form.  Our fax machine listed their fax machine as being busy and unable to receive the form, even after several attempts.  I called the patient to discuss how she would like [place] to assist her going forward.  No answer, left a voicemail.  No return call.  Carlyn Reichert, MD PGY-3

## 2023-03-16 ENCOUNTER — Encounter: Payer: Self-pay | Admitting: Pediatrics

## 2023-03-19 ENCOUNTER — Ambulatory Visit: Payer: Medicaid Other | Admitting: Internal Medicine

## 2023-04-02 ENCOUNTER — Encounter (HOSPITAL_COMMUNITY): Payer: Medicaid Other | Admitting: Student

## 2023-06-18 ENCOUNTER — Ambulatory Visit
Admission: EM | Admit: 2023-06-18 | Discharge: 2023-06-18 | Disposition: A | Discharge status: Home / Self Care | Payer: Medicaid Other | Attending: Emergency Medicine | Admitting: Emergency Medicine

## 2023-06-18 ENCOUNTER — Other Ambulatory Visit: Payer: Self-pay

## 2023-06-18 ENCOUNTER — Encounter: Payer: Self-pay | Admitting: *Deleted

## 2023-06-18 DIAGNOSIS — J302 Other seasonal allergic rhinitis: Secondary | ICD-10-CM

## 2023-06-18 DIAGNOSIS — N760 Acute vaginitis: Secondary | ICD-10-CM | POA: Diagnosis not present

## 2023-06-18 DIAGNOSIS — H1013 Acute atopic conjunctivitis, bilateral: Secondary | ICD-10-CM

## 2023-06-18 DIAGNOSIS — J029 Acute pharyngitis, unspecified: Secondary | ICD-10-CM

## 2023-06-18 LAB — POCT RAPID STREP A (OFFICE): Rapid Strep A Screen: NEGATIVE

## 2023-06-18 MED ORDER — OLOPATADINE HCL 0.2 % OP SOLN
1.0000 [drp] | Freq: Every day | OPHTHALMIC | 1 refills | Status: AC
Start: 2023-06-18 — End: ?

## 2023-06-18 MED ORDER — LEVOCETIRIZINE DIHYDROCHLORIDE 5 MG PO TABS: 5.0000 mg | ORAL_TABLET | Freq: Every evening | ORAL | 1 refills | Status: DC

## 2023-06-18 MED ORDER — FLUTICASONE PROPIONATE 50 MCG/ACT NA SUSP
1.0000 | Freq: Every day | NASAL | 2 refills | Status: AC
Start: 2023-06-18 — End: ?

## 2023-06-18 NOTE — Discharge Instructions (Addendum)
Your strep test today is negative.  Streptococcal throat culture will be performed per our protocol.  The result of your throat culture will be posted to your MyChart once it is complete, this typically takes 3 to 5 days.  If your streptococcal throat culture is positive, you will be contacted by phone and antibiotics will prescribed for you.  Your symptoms and my physical exam findings are concerning for exacerbation of your underlying allergies.     To avoid catching frequent respiratory infections, having skin reactions, dealing with eye irritation, losing sleep, missing work, etc., due to uncontrolled allergies, it is important that you begin/continue your allergy regimen and are consistent with taking your meds exactly as prescribed.   Xyzal (levocetirizine): This is an excellent second-generation antihistamine that helps to reduce respiratory inflammatory response to environmental allergens.  In some patients, this medication can cause daytime sleepiness so I recommend that you take 1 tablet daily at bedtime.     Flonase (fluticasone): This is a steroid nasal spray that used once daily, 1 spray in each nare.  This works best when used on a daily basis. This medication does not work well if it is only used when you think you need it.  After 3 to 5 days of use, you will notice significant reduction of the inflammation and mucus production that is currently being caused by exposure to allergens, whether seasonal or environmental.  The most common side effect of this medication is nosebleeds.  If you experience a nosebleed, please discontinue use for 1 week, then feel free to resume.  If you find that your insurance will not pay for this medication, please consider a different nasal steroids such as Nasonex (mometasone), or Nasacort (triamcinolone).   Pataday (olopatadine): This is an antihistamine eyedrop that can be used once daily to help relieve dry eyes, itchy eyes and red eyes.  This antihistamine  drop not only works for allergic conjunctivitis but is also very helpful with viral conjunctivitis.  Please do not use this drop more than once a day, for best relief please use this in the morning.     The results of your vaginal swab test, which screens for BV, yeast, will be posted to your MyChart account once it is complete.  This typically takes 2 to 4 days.  Please abstain from sexual intercourse of any kind, vaginal, oral or anal, until you have received the results of your STD testing.      If any of your results are abnormal, you will receive a phone call regarding treatment.  Prescriptions, if any are needed, will be provided for you at your pharmacy.

## 2023-06-18 NOTE — ED Provider Notes (Signed)
[name]    CSN: [id number] Arrival date & time: 06/18/23  0950    HISTORY   Chief Complaint  Patient presents with   Eye Drainage   HPI Holly Goodman is a pleasant, 22 y.o. female who presents to urgent care today. Body aches, subjective fever, sore throat since Monday. Taking dayquil. Last night she began having burning, itching eye pain with yellow drainage, endorses history of seasonal allergies, not currently taking any allergy medications.  Patient also complains of vaginal discharge and vaginal itching, would like to be tested for yeast.    Past Medical History:  Diagnosis Date   Eczema    Lacrimal duct stenosis [id number]   Mild persistent asthma with acute exacerbation 07/07/2012   Exacerbation off and on from 06/21/12 through 07/07/12   Sickle cell trait [facility])    Patient Active Problem List   Diagnosis Date Noted   Generalized anxiety disorder with panic attacks 02/17/2023   Social anxiety disorder 02/17/2023   Psychophysiological insomnia 02/17/2023   Vitamin D deficiency 02/17/2023   Tonsillar hypertrophy 01/01/2023   Proteinuria 12/02/2022   Leukocytes in urine 12/02/2022   Encounter for well child check without abnormal findings 04/12/2017   BMI (body mass index), pediatric, 5% to less than 85% for age 14/02/2017   History reviewed. No pertinent surgical history. OB History   No obstetric history on file.    Home Medications    Prior to Admission medications   Medication Sig Start Date End Date Taking? Authorizing Provider  EPINEPHrine (EPIPEN 2-PAK) 0.3 mg/0.3 mL IJ SOAJ injection Inject 0.3 mg into the muscle as needed for anaphylaxis. 03/19/22  Yes Klett, Pascal Lux, NP  norethindrone-ethinyl estradiol-iron (JUNEL FE 1.5/30) 1.5-30 MG-MCG tablet Take 1 tablet by mouth daily.   Yes [provider]  triamcinolone ointment (KENALOG) 0.1 % Apply twice daily for flare ups below neck, maximum 10 days. 02/02/23  Yes Patel, Ellen Henri, MD   AUROVELA 1.5/30 1.5-30 MG-MCG tablet TAKE 1 TABLET BY MOUTH DAILY. DISCARD PLACEBO TABLETS FOR CONTINUOUS CYCLING 02/03/21   Alfonso Ramus T, FNP  azelastine (ASTELIN) 0.1 % nasal spray Place 1 spray into both nostrils 2 (two) times daily as needed. Use in each nostril as directed 02/02/23   Birder Robson, MD  cetirizine (ZYRTEC ALLERGY) 10 MG tablet Take 1 tablet (10 mg total) by mouth in the morning and at bedtime. 02/02/23   Birder Robson, MD  clobetasol ointment (TEMOVATE) 0.05 % Apply 1 Application topically 2 (two) times daily.    [provider]  hydrocortisone 2.5 % cream Apply topically 2 (two) times daily. Apply twice daily for flare ups above neck, maximum 7 days. 02/02/23   Birder Robson, MD  JUNEL FE 1.5/30 1.5-30 MG-MCG tablet Take 1 tablet by mouth daily. 10/20/22   [provider]  norethindrone-ethinyl estradiol-iron (HAILEY FE 1.5/30) 1.5-30 MG-MCG tablet Take 1 tablet by mouth daily.    [provider]  norethindrone-ethinyl estradiol-iron (JUNEL FE 1.5/30) 1.5-30 MG-MCG tablet Take 1 tablet by mouth daily. 01/11/23   [provider]  sertraline (ZOLOFT) 25 MG tablet Take 1 tablet (25 mg total) by mouth daily. 02/17/23   Carlyn Reichert, MD  traZODone (DESYREL) 50 MG tablet Take half a tablet (25 mg total) nightly as needed once. Thereafter take one tablet (50 mg total) nightly as needed. 02/17/23   Carlyn Reichert, MD    Family History Family History  Problem Relation Age of Onset   Allergic  rhinitis Mother    Sickle cell trait Mother    Miscarriages / [place] Mother    Thyroid disease Maternal Aunt    Hypertension Maternal Grandmother    Hyperlipidemia Maternal Grandmother    Diabetes Maternal Grandfather    Hypertension Maternal Grandfather    Cancer Maternal Grandfather        prostate   Hearing loss Maternal Freight forwarder exposure   Alcohol abuse Neg Hx    Arthritis Neg Hx    Asthma Neg Hx    Birth defects Neg  Hx    COPD Neg Hx    Depression Neg Hx    Drug abuse Neg Hx    Early death Neg Hx    Heart disease Neg Hx    Kidney disease Neg Hx    Learning disabilities Neg Hx    Mental illness Neg Hx    Mental retardation Neg Hx    Stroke Neg Hx    Vision loss Neg Hx    Varicose Veins Neg Hx    Social History Social History   Tobacco Use   Smoking status: Never    Passive exposure: Current   Smokeless tobacco: Never  Vaping Use   Vaping status: Never Used  Substance Use Topics   Alcohol use: No   Drug use: No   Allergies   Pineapple, Other, Latex, and Wool alcohol [lanolin]  Review of Systems Review of Systems Pertinent findings revealed after performing a 14 point review of systems has been noted in the history of present illness.  Physical Exam Vital Signs BP 129/82 (BP Location: Left Arm)   Pulse 81   Temp 99.3 F (37.4 C) (Oral)   Resp 16   SpO2 100%   No data found.  Physical Exam Vitals and nursing note reviewed.  Constitutional:      General: She is not in acute distress.    Appearance: Normal appearance. She is not ill-appearing.  HENT:     Head: Normocephalic and atraumatic.     Salivary Glands: Right salivary gland is not diffusely enlarged or tender. Left salivary gland is not diffusely enlarged or tender.     Right Ear: Ear canal and external ear normal. No drainage. A middle ear effusion is present. There is no impacted cerumen. Tympanic membrane is bulging. Tympanic membrane is not injected or erythematous.     Left Ear: Ear canal and external ear normal. No drainage. A middle ear effusion is present. There is no impacted cerumen. Tympanic membrane is bulging. Tympanic membrane is not injected or erythematous.     Ears:     Comments: Bilateral EACs normal, both TMs bulging with clear fluid    Nose: Rhinorrhea present. No nasal deformity, septal deviation, signs of injury, nasal tenderness, mucosal edema or congestion. Rhinorrhea is clear.     Right Nostril:  Occlusion present. No foreign body, epistaxis or septal hematoma.     Left Nostril: Occlusion present. No foreign body, epistaxis or septal hematoma.     Right Turbinates: Enlarged, swollen and pale.     Left Turbinates: Enlarged, swollen and pale.     Right Sinus: No maxillary sinus tenderness or frontal sinus tenderness.     Left Sinus: No maxillary sinus tenderness or frontal sinus tenderness.     Mouth/Throat:     Lips: Pink. No lesions.     Mouth: Mucous membranes are moist. No oral lesions.     Pharynx: Oropharynx is clear.  Uvula midline. No posterior oropharyngeal erythema or uvula swelling.     Tonsils: No tonsillar exudate. 0 on the right. 0 on the left.     Comments: Postnasal drip Eyes:     General: Lids are normal.        Right eye: No discharge.        Left eye: No discharge.     Extraocular Movements: Extraocular movements intact.     Conjunctiva/sclera: Conjunctivae normal.     Right eye: Right conjunctiva is not injected.     Left eye: Left conjunctiva is not injected.  Neck:     Trachea: Trachea and phonation normal.  Cardiovascular:     Rate and Rhythm: Normal rate and regular rhythm.     Pulses: Normal pulses.     Heart sounds: Normal heart sounds. No murmur heard.    No friction rub. No gallop.  Pulmonary:     Effort: Pulmonary effort is normal. No accessory muscle usage, prolonged expiration or respiratory distress.     Breath sounds: Normal breath sounds. No stridor, decreased air movement or transmitted upper airway sounds. No decreased breath sounds, wheezing, rhonchi or rales.  Chest:     Chest wall: No tenderness.  Genitourinary:    Comments: Patient politely declines pelvic exam today, patient provided a vaginal swab for testing. Musculoskeletal:        General: Normal range of motion.     Cervical back: Normal range of motion and neck supple. Normal range of motion.  Lymphadenopathy:     Cervical: No cervical adenopathy.  Skin:    General: Skin is  warm and dry.     Findings: No erythema or rash.  Neurological:     General: No focal deficit present.     Mental Status: She is alert and oriented to person, place, and time.  Psychiatric:        Mood and Affect: Mood normal.        Behavior: Behavior normal.     Visual Acuity Right Eye Distance: 20/20 (Uncorrected) Left Eye Distance: 20/20 (Uncorrected) Bilateral Distance: 20/15 (Uncorrected)  Right Eye Near:   Left Eye Near:    Bilateral Near:     UC Couse / Diagnostics / Procedures:     Radiology No results found.  Procedures Procedures (including critical care time) EKG  Pending results:  Labs Reviewed  POCT RAPID STREP A (OFFICE) - Normal  CULTURE, GROUP A STREP [facility])  CERVICOVAGINAL ANCILLARY ONLY    Medications Ordered in UC: Medications - No data to display  UC Diagnoses / Final Clinical Impressions(s)   I have reviewed the triage vital signs and the nursing notes.  Pertinent labs & imaging results that were available during my care of the patient were reviewed by me and considered in my medical decision making (see chart for details).    Final diagnoses:  Seasonal allergic rhinitis, unspecified trigger  Allergic conjunctivitis of both eyes  Acute pharyngitis, unspecified etiology  Vaginitis and vulvovaginitis   Rapid strep test today was negative, throat culture pending, will provide patient with antibiotics if positive.  Will notify patient with results of vaginal swab looking for yeast and BV, treat as needed based on those results.  Patient advised that physical exam findings concerning for uncontrolled allergies, recommend resume second-generation antihistamine, nasal steroid and have added Pataday eyedrops for eye symptoms.  Conservative care recommended.  Return precautions advised  Please see discharge instructions below for details of plan of care as provided  to patient. ED Prescriptions     Medication Sig Dispense Auth. Provider    levocetirizine (XYZAL) 5 MG tablet Take 1 tablet (5 mg total) by mouth every evening. 90 tablet Theadora Rama Scales, PA-C   fluticasone (FLONASE) 50 MCG/ACT nasal spray Place 1 spray into both nostrils daily. Begin by using 2 sprays in each nare daily for 3 to 5 days, then decrease to 1 spray in each nare daily. 15.8 mL Theadora Rama Scales, PA-C   Olopatadine HCl (PATADAY) 0.2 % SOLN Apply 1 drop to eye daily. 2.5 mL Theadora Rama Scales, PA-C      PDMP not reviewed this encounter.  Pending results:  Labs Reviewed  POCT RAPID STREP A (OFFICE) - Normal  CULTURE, GROUP A STREP [facility])  CERVICOVAGINAL ANCILLARY ONLY      Discharge Instructions      Your strep test today is negative.  Streptococcal throat culture will be performed per our protocol.  The result of your throat culture will be posted to your MyChart once it is complete, this typically takes 3 to 5 days.  If your streptococcal throat culture is positive, you will be contacted by phone and antibiotics will prescribed for you.  Your symptoms and my physical exam findings are concerning for exacerbation of your underlying allergies.     To avoid catching frequent respiratory infections, having skin reactions, dealing with eye irritation, losing sleep, missing work, etc., due to uncontrolled allergies, it is important that you begin/continue your allergy regimen and are consistent with taking your meds exactly as prescribed.   Xyzal (levocetirizine): This is an excellent second-generation antihistamine that helps to reduce respiratory inflammatory response to environmental allergens.  In some patients, this medication can cause daytime sleepiness so I recommend that you take 1 tablet daily at bedtime.     Flonase (fluticasone): This is a steroid nasal spray that used once daily, 1 spray in each nare.  This works best when used on a daily basis. This medication does not work well if it is only used when you think you need it.   After 3 to 5 days of use, you will notice significant reduction of the inflammation and mucus production that is currently being caused by exposure to allergens, whether seasonal or environmental.  The most common side effect of this medication is nosebleeds.  If you experience a nosebleed, please discontinue use for 1 week, then feel free to resume.  If you find that your insurance will not pay for this medication, please consider a different nasal steroids such as Nasonex (mometasone), or Nasacort (triamcinolone).   Pataday (olopatadine): This is an antihistamine eyedrop that can be used once daily to help relieve dry eyes, itchy eyes and red eyes.  This antihistamine drop not only works for allergic conjunctivitis but is also very helpful with viral conjunctivitis.  Please do not use this drop more than once a day, for best relief please use this in the morning.     The results of your vaginal swab test, which screens for BV, yeast, will be posted to your MyChart account once it is complete.  This typically takes 2 to 4 days.  Please abstain from sexual intercourse of any kind, vaginal, oral or anal, until you have received the results of your STD testing.      If any of your results are abnormal, you will receive a phone call regarding treatment.  Prescriptions, if any are needed, will be provided for you at your pharmacy.  Disposition Upon Discharge:  Condition: stable for discharge home  Patient presented with an acute illness with associated systemic symptoms and significant discomfort requiring urgent management. In my opinion, this is a condition that a prudent lay person (someone who possesses an average knowledge of health and medicine) may potentially expect to result in complications if not addressed urgently such as respiratory distress, impairment of bodily function or dysfunction of bodily organs.   Routine symptom specific, illness specific and/or disease specific  instructions were discussed with the patient and/or caregiver at length.   As such, the patient has been evaluated and assessed, work-up was performed and treatment was provided in alignment with urgent care protocols and evidence based medicine.  Patient/parent/caregiver has been advised that the patient may require follow up for further testing and treatment if the symptoms continue in spite of treatment, as clinically indicated and appropriate.  Patient/parent/caregiver has been advised to return to the [facility] or PCP if no better; to PCP or the Emergency Department if new signs and symptoms develop, or if the current signs or symptoms continue to change or worsen for further workup, evaluation and treatment as clinically indicated and appropriate  The patient will follow up with their current PCP if and as advised. If the patient does not currently have a PCP we will assist them in obtaining one.   The patient may need specialty follow up if the symptoms continue, in spite of conservative treatment and management, for further workup, evaluation, consultation and treatment as clinically indicated and appropriate.  Patient/parent/caregiver verbalized understanding and agreement of plan as discussed.  All questions were addressed during visit.  Please see discharge instructions below for further details of plan.  This office note has been dictated using Teaching laboratory technician.  Unfortunately, this method of dictation can sometimes lead to typographical or grammatical errors.  I apologize for your inconvenience in advance if this occurs.  Please do not hesitate to reach out to me if clarification is needed.      Theadora Rama Scales, PA-C 06/18/23 1124

## 2023-06-18 NOTE — ED Triage Notes (Signed)
Body aches, subjective fever, sore throat since Monday. Taking dayquil. Last night she began having burning, itching eye pain with yellow drainage

## 2023-06-21 ENCOUNTER — Other Ambulatory Visit: Payer: Self-pay

## 2023-06-21 ENCOUNTER — Emergency Department (HOSPITAL_COMMUNITY)
Admission: EM | Admit: 2023-06-21 | Discharge: 2023-06-21 | Disposition: A | Discharge status: Home / Self Care | Payer: Medicaid Other | Attending: Emergency Medicine | Admitting: Emergency Medicine

## 2023-06-21 ENCOUNTER — Encounter (HOSPITAL_COMMUNITY): Payer: Self-pay

## 2023-06-21 ENCOUNTER — Telehealth (HOSPITAL_COMMUNITY): Payer: Self-pay

## 2023-06-21 DIAGNOSIS — J45909 Unspecified asthma, uncomplicated: Secondary | ICD-10-CM | POA: Diagnosis not present

## 2023-06-21 DIAGNOSIS — J069 Acute upper respiratory infection, unspecified: Secondary | ICD-10-CM | POA: Diagnosis not present

## 2023-06-21 DIAGNOSIS — Z91040 Latex allergy status: Secondary | ICD-10-CM | POA: Insufficient documentation

## 2023-06-21 DIAGNOSIS — R059 Cough, unspecified: Secondary | ICD-10-CM | POA: Diagnosis present

## 2023-06-21 LAB — CERVICOVAGINAL ANCILLARY ONLY
Bacterial Vaginitis (gardnerella): NEGATIVE
Candida Glabrata: NEGATIVE
Candida Vaginitis: POSITIVE — AB
Comment: NEGATIVE
Comment: NEGATIVE
Comment: NEGATIVE

## 2023-06-21 LAB — CULTURE, GROUP A STREP (THRC)

## 2023-06-21 MED ORDER — AMOXICILLIN-POT CLAVULANATE 875-125 MG PO TABS
1.0000 | ORAL_TABLET | Freq: Two times a day (BID) | ORAL | 0 refills | Status: AC
Start: 2023-06-23 — End: 2023-06-30

## 2023-06-21 MED ORDER — KETOROLAC TROMETHAMINE 15 MG/ML IJ SOLN
15.0000 mg | Freq: Once | INTRAMUSCULAR | Status: AC
  Administered 2023-06-21: 15 mg via INTRAMUSCULAR
  Filled 2023-06-21: qty 1

## 2023-06-21 MED ORDER — FLUCONAZOLE 150 MG PO TABS: 150.0000 mg | ORAL_TABLET | Freq: Once | ORAL | 0 refills | Status: AC

## 2023-06-21 NOTE — Telephone Encounter (Signed)
Per protocol, pt requires tx with Diflucan.  Rx sent to pharmacy on file.

## 2023-06-21 NOTE — Discharge Instructions (Addendum)
It was a pleasure caring for you today.  Please follow-up with your primary care provider.  I am sending antibiotics for if your symptoms are not starting to resolve in the next 3 days.  Please seek emergency care if experiencing any new or worsening symptoms.

## 2023-06-21 NOTE — ED Provider Notes (Signed)
[place] EMERGENCY DEPARTMENT AT [facility] Provider Note   CSN: [id number] Arrival date & time: 06/21/23  9562     History  Chief Complaint  Patient presents with   URI   Sore Throat    Holly Goodman is a 22 y.o. female with PMHx asthma, sickle cell trait, eczema who presents to ED concerned for subjective fever, intermittent HA, rhinorrhea, congestion, itching eyes x7 days. Patient most concerned for the amount of mucus her nose is producing. Patient denies headache currently. Took Dayquil yesterday which helped symptoms.  Denies nausea, vomiting, diarrhea, abdominal pain.  Denies shortness of breath or chest pain.  Patient did present to urgent care 3 days ago and obtained swabs which were negative.   URI Presenting symptoms: congestion   Sore Throat       Home Medications Prior to Admission medications   Medication Sig Start Date End Date Taking? Authorizing Provider  amoxicillin-clavulanate (AUGMENTIN) 875-125 MG tablet Take 1 tablet by mouth every 12 (twelve) hours for 7 days. 06/23/23 06/30/23 Yes Valrie Hart F, PA-C  EPINEPHrine (EPIPEN 2-PAK) 0.3 mg/0.3 mL IJ SOAJ injection Inject 0.3 mg into the muscle as needed for anaphylaxis. 03/19/22   Klett, Pascal Lux, NP  fluticasone (FLONASE) 50 MCG/ACT nasal spray Place 1 spray into both nostrils daily. Begin by using 2 sprays in each nare daily for 3 to 5 days, then decrease to 1 spray in each nare daily. 06/18/23   Theadora Rama Scales, PA-C  levocetirizine (XYZAL) 5 MG tablet Take 1 tablet (5 mg total) by mouth every evening. 06/18/23 12/15/23  Theadora Rama Scales, PA-C  norethindrone-ethinyl estradiol-iron (JUNEL FE 1.5/30) 1.5-30 MG-MCG tablet Take 1 tablet by mouth daily. 01/11/23   [provider]  Olopatadine HCl (PATADAY) 0.2 % SOLN Apply 1 drop to eye daily. 06/18/23   Theadora Rama Scales, PA-C  triamcinolone ointment (KENALOG) 0.1 % Apply twice daily for flare ups below neck, maximum  10 days. 02/02/23   Birder Robson, MD      Allergies    Pineapple, Other, Latex, and Wool alcohol [lanolin]    Review of Systems   Review of Systems  HENT:  Positive for congestion.     Physical Exam Updated Vital Signs BP 119/75 (BP Location: Right Arm)   Pulse 65   Temp 98 F (36.7 C) (Oral)   Resp 16   LMP  (LMP Unknown)   SpO2 100%  Physical Exam Vitals and nursing note reviewed.  Constitutional:      General: She is not in acute distress.    Appearance: She is not ill-appearing or toxic-appearing.  HENT:     Head: Normocephalic and atraumatic.     Comments: No maxillary or frontal sinus tenderness to palpation.    Mouth/Throat:     Mouth: Mucous membranes are moist.  Eyes:     General: No scleral icterus.       Right eye: No discharge.        Left eye: No discharge.     Conjunctiva/sclera: Conjunctivae normal.  Cardiovascular:     Rate and Rhythm: Normal rate and regular rhythm.     Pulses: Normal pulses.     Heart sounds: Normal heart sounds. No murmur heard. Pulmonary:     Effort: Pulmonary effort is normal. No respiratory distress.     Breath sounds: Normal breath sounds. No wheezing, rhonchi or rales.  Abdominal:     General: Bowel sounds are normal. There is no distension.  Palpations: Abdomen is soft. There is no mass.     Tenderness: There is no abdominal tenderness.  Musculoskeletal:     Right lower leg: No edema.     Left lower leg: No edema.  Skin:    General: Skin is warm and dry.     Findings: No rash.  Neurological:     General: No focal deficit present.     Mental Status: She is alert. Mental status is at baseline.  Psychiatric:        Mood and Affect: Mood normal.        Behavior: Behavior normal.     ED Results / Procedures / Treatments   Labs (all labs ordered are listed, but only abnormal results are displayed) Labs Reviewed - No data to display  EKG None  Radiology No results found.  Procedures Procedures     Medications Ordered in ED Medications  ketorolac (TORADOL) 15 MG/ML injection 15 mg (15 mg Intramuscular Given 06/21/23 0935)    ED Course/ Medical Decision Making/ A&P                                 Medical Decision Making Risk Prescription drug management.    This patient presents to the ED for concern of congestion, rhinorrhea, intermittent headaches, fever, this involves an extensive number of treatment options, and is a complaint that carries with it a high risk of complications and morbidity.  The differential diagnosis includes Flu/COVID/RSV, sinusitis, pneumonia, meningitis.   Co morbidities that complicate the patient evaluation  None   Additional history obtained:  It does not appear the patient follows with PCP.  Will refer to community clinic.   Problem List / ED Course / Critical interventions / Medication management  Patient presents to ED concern for multiple viral symptoms.  Patient was most concerned over the amount of mucus her nose is producing.  Patient went to urgent care 3 days ago, had swabs obtained which were negative, discharged in good condition.  Physical exam unremarkable.  Patient afebrile with stable vitals. Provided patient with a dose of Toradol.  Patient stating that she feels a lot better afterwards.  Educated patient on using decongestants at other symptomatic medications for her viral URI.  Given that patient is having a lot of nasal drainage, I will send her with a prescription that she can take if her symptoms are not resolving in the next 2-3 days.  I will cover patient for possible developing sinusitis.  Recommend following up with primary care provider.  Patient verbalized understanding of plan. I have reviewed the patients home medicines and have made adjustments as needed Patient afebrile with stable vitals.  Provided with return precautions.  Discharged in good condition.   DDx: These are considered less likely due to history of  present illness and physical exam findings -PNA: Lungs clear to auscultation bilaterally -Meningitis: patient's symptoms, vital signs, physical exam findings including lack of meningismus seem grossly less consistent at this time -Flu/COVID/RSV: Respiratory panel negative    Social Determinants of Health:  none          Final Clinical Impression(s) / ED Diagnoses Final diagnoses:  Upper respiratory tract infection, unspecified type    Rx / DC Orders ED Discharge Orders          Ordered    amoxicillin-clavulanate (AUGMENTIN) 875-125 MG tablet  Every 12 hours  06/21/23 1006              Dorthy Cooler, [place] 06/21/23 1257    Terald Sleeper, MD 06/21/23 [phone]

## 2023-06-21 NOTE — ED Triage Notes (Signed)
Pt c.o sore throat, cough, congestion and eye drainage for the past week. Pt was seen on 12/13 at [facility] and given rx for eye drops, flonase and an allergy med. Pt tested negative for strept. Pt states her symptoms are not getting any better

## 2023-10-12 DIAGNOSIS — Z743 Need for continuous supervision: Secondary | ICD-10-CM | POA: Diagnosis not present

## 2023-10-12 DIAGNOSIS — L299 Pruritus, unspecified: Secondary | ICD-10-CM | POA: Diagnosis not present

## 2023-10-12 DIAGNOSIS — R058 Other specified cough: Secondary | ICD-10-CM | POA: Diagnosis not present

## 2023-10-12 NOTE — ED Triage Notes (Signed)
 Chief Complaint  Patient presents with  . Allergic Reaction    Patient reports sudden onset of itchy eyes and SOB; denies SOB at this time; denies any contact with new triggers; no s/s of resp. Distress in triage.   BP 122/70   Pulse 86   Temp 36.7 C (98 F) (Oral)   Resp 18   Ht 160 cm (5' 3)   Wt 55.2 kg (121 lb 9.6 oz)   LMP 10/06/2023 (Approximate)   SpO2 99%   BMI 21.54 kg/m

## 2023-10-12 NOTE — ED Triage Notes (Signed)
 Patient concerned that she is having allergic reaction to something. Patient has seasonal allergy  symptoms. No hives and no itching noted.

## 2024-01-12 DIAGNOSIS — Z113 Encounter for screening for infections with a predominantly sexual mode of transmission: Secondary | ICD-10-CM | POA: Diagnosis not present

## 2024-01-12 DIAGNOSIS — N760 Acute vaginitis: Secondary | ICD-10-CM | POA: Diagnosis not present

## 2024-03-23 DIAGNOSIS — R002 Palpitations: Secondary | ICD-10-CM | POA: Diagnosis not present

## 2024-03-23 NOTE — ED Triage Notes (Signed)
 Pt coming from home. Pt reports chest pain x 2days. No SOB. Pt reports heart palpations. VSS. GCS 15

## 2024-03-23 NOTE — Progress Notes (Signed)
 " Cardiology Office Note    Patient Name: Holly Goodman Date of Encounter: 03/23/2024  Primary Care Provider:  Pcp, No Primary Cardiologist:  Alvan Ronal BRAVO, MD (Inactive) Primary Electrophysiologist: None   Past Medical History    Past Medical History:  Diagnosis Date   Eczema    Lacrimal duct stenosis [id number]   Mild persistent asthma with acute exacerbation 07/07/2012   Exacerbation off and on from 06/21/12 through 07/07/12   Sickle cell trait (HCC)     History of Present Illness  Holly Goodman is a 23 y.o. female with a PMH of palpitations, mild persistent asthma, mild to moderate MR, sickle cell trait who presents today for complaint of palpitations.  Holly Goodman was seen by Dr. Ronal Alvan on 05/2022 for complaint of palpitations.  She completed a 2D echo that showed normal EF with no RWMA with mild to moderate MVR.  She was seen on 03/23/2024 at [facility] with complaint of chest pain x 2 days with heart palpitations. She completed a chest x-ray that was normal with no evidence of acute cardiac or pulmonary abnormalities.  EKG was completed showing sinus arrhythmia with a rate of 86 bpm.    Holly Goodman presents today with her mother for complaint of flutters and chest pressure. Holly Goodman is a 23 year old female with mitral valve regurgitation who presents with chest pain and palpitations. She experiences intermittent chest pain and palpitations, described as 'flutters,' occurring sporadically throughout the day, often when at rest or lying down. These episodes do not correlate with physical activity and are not accompanied by shortness of breath or dizziness. The chest pain is described as a 'tight sensation' that tends to alleviate as she calms down. She has a history of mild to moderate mitral valve regurgitation, identified during an echocardiogram. She was previously cleared for sports participation despite the valve condition. Her symptoms began at age 41, with an episode of  palpitations leading to an emergency room visit where her heart rate was noted to increase significantly. She associates some of her symptoms with anxiety, particularly during stressful periods such as school. She has been prescribed Zoloft  for anxiety but has not yet started the medication. Her mother reports a similar history of palpitations during her own youth, which resolved without intervention. Patient denies  dyspnea, PND, orthopnea, nausea, vomiting, dizziness, syncope, edema, weight gain, or early satiety.  Discussed the use of AI scribe software for clinical note transcription with the patient, who gave verbal consent to proceed.  History of Present Illness    Review of Systems  Please see the history of present illness.    All other systems reviewed and are otherwise negative except as noted above.  Physical Exam    Wt Readings from Last 3 Encounters:  02/02/23 120 lb 4.8 oz (54.6 kg) (35%, Z= -0.39)*  02/01/23 119 lb (54 kg) (32%, Z= -0.46)*  12/31/22 117 lb 4.8 oz (53.2 kg) (29%, Z= -0.55)*   * Growth percentiles are based on CDC (Girls, 2-20 Years) data.   CD:Uyzmz were no vitals filed for this visit.,There is no height or weight on file to calculate BMI. GEN: Well nourished, well developed in no acute distress Neck: No JVD; No carotid bruits Pulmonary: Clear to auscultation without rales, wheezing or rhonchi  Cardiovascular: Normal rate. Regular rhythm. Normal S1. Normal S2.   Murmurs: There is no murmur.  ABDOMEN: Soft, non-tender, non-distended EXTREMITIES:  No edema; No deformity   EKG/LABS/ Recent  Cardiac Studies   ECG personally reviewed by me today -sinus rhythm with PACs and no acute changes.  Risk Assessment/Calculations:        Lab Results  Component Value Date   WBC 4.8 06/17/2021   HGB 12.8 06/17/2021   HCT 39.5 06/17/2021   MCV 81.4 06/17/2021   PLT 400 06/17/2021   Lab Results  Component Value Date   CREATININE 0.86 06/17/2021   BUN 8  06/17/2021   NA 137 06/17/2021   K 4.2 06/17/2021   CL 102 06/17/2021   CO2 26 06/17/2021   No results found for: CHOL, HDL, LDLCALC, LDLDIRECT, TRIG, CHOLHDL  No results found for: HGBA1C Assessment & Plan    Assessment & Plan   1.  Palpitations -Intermittent chest pain and palpitations at rest. EKG shows sinus arrhythmia with no ischemia or infarction. Possible arrhythmia or stress-related response, exacerbated by anxiety. - Order Zio event monitor for 7 days to evaluate palpitations. - Order exercise treadmill test to assess EKG response to exercise. - Order cardiac calcium score to evaluate coronary artery calcium.  2.  Mitral valve regurgitation, mild to moderate Mild to moderate mitral valve regurgitation with normal ejection fraction and heart structure. No stenosis or pericardial effusion. Requires monitoring every 3-5 years. - Order follow-up echocardiogram in 2026 to reassess mitral valve regurgitation.  3.Generalized anxiety disorder Anxiety may contribute to palpitations and chest tightness. Zoloft  prescribed but not started. Discussed potential side effects and encouraged therapy and coping strategies. - Discuss with primary care provider about starting Zoloft  or exploring therapy options. - Consider non-pharmacological interventions such as therapy, meditation, or yoga for anxiety management.    Disposition: Follow-up with Dr. Sheena or APP in 6 months Informed Consent   Shared Decision Making/Informed Consent The risks [chest pain, shortness of breath, cardiac arrhythmias, dizziness, blood pressure fluctuations, myocardial infarction, stroke/transient ischemic attack, and life-threatening complications (estimated to be 1 in 10,000)], benefits (risk stratification, diagnosing coronary artery disease, treatment guidance) and alternatives of an exercise tolerance test were discussed in detail with Holly Goodman and she agrees to proceed.      Signed, Wyn Raddle,  Jackee Shove, NP 03/23/2024, 7:18 PM [facility] "

## 2024-03-24 ENCOUNTER — Ambulatory Visit: Attending: Nurse Practitioner | Admitting: Nurse Practitioner

## 2024-03-24 ENCOUNTER — Encounter: Payer: Self-pay | Admitting: Nurse Practitioner

## 2024-03-24 ENCOUNTER — Ambulatory Visit

## 2024-03-24 VITALS — BP 106/62 | HR 88 | Ht 63.0 in | Wt 123.0 lb

## 2024-03-24 DIAGNOSIS — R079 Chest pain, unspecified: Secondary | ICD-10-CM

## 2024-03-24 DIAGNOSIS — F410 Panic disorder [episodic paroxysmal anxiety] without agoraphobia: Secondary | ICD-10-CM | POA: Insufficient documentation

## 2024-03-24 DIAGNOSIS — I340 Nonrheumatic mitral (valve) insufficiency: Secondary | ICD-10-CM | POA: Diagnosis not present

## 2024-03-24 DIAGNOSIS — F411 Generalized anxiety disorder: Secondary | ICD-10-CM | POA: Diagnosis not present

## 2024-03-24 DIAGNOSIS — R002 Palpitations: Secondary | ICD-10-CM

## 2024-03-24 NOTE — Progress Notes (Unsigned)
Enrolled patient for a 7 day Zio XT monitor to be mailed to patients home   Tobb to read

## 2024-03-24 NOTE — Patient Instructions (Addendum)
 Medication Instructions:  Your physician recommends that you continue on your current medications as directed. Please refer to the Current Medication list given to you today. *If you need a refill on your cardiac medications before your next appointment, please call your pharmacy*  Lab Work: None ordered If you have labs (blood work) drawn today and your tests are completely normal, you will receive your results only by: MyChart Message (if you have MyChart) OR A paper copy in the mail If you have any lab test that is abnormal or we need to change your treatment, we will call you to review the results.  Testing/Procedures: Calcium score   Your physician has requested that you have an exercise tolerance test. For further information please visit [email or web address] Please also follow instruction sheet, as given.  ZIO XT- Long Term Monitor Instructions  Your physician has requested you wear a ZIO patch monitor for 7 days.  This is a single patch monitor. [employer] supplies one patch monitor per enrollment. Additional stickers are not available. Please do not apply patch if you will be having a Nuclear Stress Test,  Echocardiogram, Cardiac CT, MRI, or Chest Xray during the period you would be wearing the  monitor. The patch cannot be worn during these tests. You cannot remove and re-apply the  ZIO XT patch monitor.  Your ZIO patch monitor will be mailed 3 day USPS to your address on file. It may take 3-5 days  to receive your monitor after you have been enrolled.  Once you have received your monitor, please review the enclosed instructions. Your monitor  has already been registered assigning a specific monitor serial # to you.  Billing and Patient Assistance Program Information  We have supplied [employer] with any of your insurance information on file for billing purposes. [employer] offers a sliding scale Patient Assistance Program for patients that do not have  insurance, or whose insurance  does not completely cover the cost of the ZIO monitor.  You must apply for the Patient Assistance Program to qualify for this discounted rate.  To apply, please call [employer] at [phone], select option 4, select option 2, ask to apply for  Patient Assistance Program. Meredeth will ask your household income, and how many people  are in your household. They will quote your out-of-pocket cost based on that information.  [employer] will also be able to set up a 72-month, interest-free payment plan if needed.  Applying the monitor   Shave hair from upper left chest.  Hold abrader disc by orange tab. Rub abrader in 40 strokes over the upper left chest as  indicated in your monitor instructions.  Clean area with 4 enclosed alcohol pads. Let dry.  Apply patch as indicated in monitor instructions. Patch will be placed under collarbone on left  side of chest with arrow pointing upward.  Rub patch adhesive wings for 2 minutes. Remove white label marked 1. Remove the white  label marked 2. Rub patch adhesive wings for 2 additional minutes.  While looking in a mirror, press and release button in center of patch. A small green light will  flash 3-4 times. This will be your only indicator that the monitor has been turned on.  Do not shower for the first 24 hours. You may shower after the first 24 hours.  Press the button if you feel a symptom. You will hear a small click. Record Date, Time and  Symptom in the Patient Logbook.  When you are ready to remove the  patch, follow instructions on the last 2 pages of Patient  Logbook. Stick patch monitor onto the last page of Patient Logbook.  Place Patient Logbook in the blue and white box. Use locking tab on box and tape box closed  securely. The blue and white box has prepaid postage on it. Please place it in the mailbox as  soon as possible. Your physician should have your test results approximately 7 days after the  monitor has been mailed back to  [facility].  Call [facility] Customer Care at [phone] if you have questions regarding  your ZIO XT patch monitor. Call them immediately if you see an orange light blinking on your  monitor.  If your monitor falls off in less than 4 days, contact our Monitor department at [phone].  If your monitor becomes loose or falls off after 4 days call [employer] at [phone] for  suggestions on securing your monitor   Follow-Up: At [facility], you and your health needs are our priority.  As part of our continuing mission to provide you with exceptional heart care, our providers are all part of one team.  This team includes your primary Cardiologist (physician) and Advanced Practice Providers or APPs (Physician Assistants and Nurse Practitioners) who all work together to provide you with the care you need, when you need it.  Your next appointment:   6 month(s)  Provider:   Kardie Tobb, MD    We recommend signing up for the patient portal called MyChart.  Sign up information is provided on this After Visit Summary.  MyChart is used to connect with patients for Virtual Visits (Telemedicine).  Patients are able to view lab/test results, encounter notes, upcoming appointments, etc.  Non-urgent messages can be sent to your provider as well.   To learn more about what you can do with MyChart, go to [email or web address].   Other Instructions

## 2024-04-04 ENCOUNTER — Encounter (HOSPITAL_COMMUNITY): Payer: Self-pay

## 2024-04-14 ENCOUNTER — Ambulatory Visit: Admitting: Physician Assistant

## 2024-04-14 ENCOUNTER — Ambulatory Visit (HOSPITAL_COMMUNITY): Admission: RE | Admit: 2024-04-14 | Source: Ambulatory Visit

## 2024-04-14 ENCOUNTER — Encounter (HOSPITAL_COMMUNITY): Payer: Self-pay | Admitting: Nurse Practitioner

## 2024-05-02 ENCOUNTER — Ambulatory Visit (HOSPITAL_COMMUNITY): Payer: Self-pay | Admitting: Nurse Practitioner

## 2024-05-02 DIAGNOSIS — R002 Palpitations: Secondary | ICD-10-CM

## 2024-05-02 DIAGNOSIS — R079 Chest pain, unspecified: Secondary | ICD-10-CM

## 2024-05-29 ENCOUNTER — Encounter: Payer: Self-pay | Admitting: Emergency Medicine

## 2024-05-29 ENCOUNTER — Ambulatory Visit: Admission: EM | Admit: 2024-05-29 | Discharge: 2024-05-29 | Disposition: A | Discharge status: Home / Self Care

## 2024-05-29 DIAGNOSIS — G44209 Tension-type headache, unspecified, not intractable: Secondary | ICD-10-CM | POA: Diagnosis not present

## 2024-05-29 DIAGNOSIS — R519 Headache, unspecified: Secondary | ICD-10-CM | POA: Diagnosis not present

## 2024-05-29 MED ORDER — KETOROLAC TROMETHAMINE 30 MG/ML IJ SOLN
30.0000 mg | Freq: Once | INTRAMUSCULAR | Status: AC
  Administered 2024-05-29: 30 mg via INTRAMUSCULAR

## 2024-05-29 MED ORDER — FEXOFENADINE-PSEUDOEPHED ER 180-240 MG PO TB24: 1.0000 | ORAL_TABLET | Freq: Every day | ORAL | 0 refills | Status: AC

## 2024-05-29 MED ORDER — CYCLOBENZAPRINE HCL 5 MG PO TABS
5.0000 mg | ORAL_TABLET | Freq: Three times a day (TID) | ORAL | 0 refills | Status: AC | PRN
Start: 2024-05-29 — End: ?

## 2024-05-29 NOTE — ED Triage Notes (Signed)
 Pt presents c/o migraines x 7 days. Pt states,  I have been having some migraine pain. I recently stopped taking my BC. When I was off the meds I started the migraines. I have been back on the meds for about 6 days and the migraines have not stopped. The migraines are light sensitive

## 2024-05-29 NOTE — ED Provider Notes (Signed)
 [name]    CSN: [id number] Arrival date & time: 05/29/24  1442      History   Chief Complaint Chief Complaint  Patient presents with   Migraine    HPI Holly Goodman is a 23 y.o. female.   Pt presents today due to 6-7 days of headaches that are not being resolved with use of Tylenol , Excedrin, or ibuprofen. Pt states that she has been experiencing some sensitivity to light. Pt denies a diagnoses of migraine but does have a history of seasonal allergies but she does not take any medications daily for them. Pt does admits to sneezing, nasal congestion, and nasal drainage. Pt states that she is experiencing pain all around her head, denies pain in neck and shoulders. Pt states that it all started after missing some dosages of birth control. Pt denies nausea, vomiting, dizziness, or blurred vision.   The history is provided by the patient.  Migraine    Past Medical History:  Diagnosis Date   Eczema    Lacrimal duct stenosis [id number]   Mild persistent asthma with acute exacerbation 07/07/2012   Exacerbation off and on from 06/21/12 through 07/07/12   Sickle cell trait     Patient Active Problem List   Diagnosis Date Noted   Generalized anxiety disorder with panic attacks 02/17/2023   Social anxiety disorder 02/17/2023   Psychophysiological insomnia 02/17/2023   Vitamin D  deficiency 02/17/2023   Tonsillar hypertrophy 01/01/2023   Proteinuria 12/02/2022   Leukocytes in urine 12/02/2022   Encounter for well child check without abnormal findings 04/12/2017   BMI (body mass index), pediatric, 5% to less than 85% for age 23/02/2017    History reviewed. No pertinent surgical history.  OB History   No obstetric history on file.      Home Medications    Prior to Admission medications   Medication Sig Start Date End Date Taking? Authorizing Provider  cyclobenzaprine  (FLEXERIL ) 5 MG tablet Take 1 tablet (5 mg total) by mouth 3 (three) times daily as needed.  05/29/24  Yes Andra Krabbe C, PA-C  fexofenadine -pseudoephedrine ([name]-D ALLERGY  & CONGESTION) 180-240 MG 24 hr tablet Take 1 tablet by mouth daily. 05/29/24  Yes Andra Krabbe C, PA-C  fluconazole  (DIFLUCAN ) 150 MG tablet Take by mouth. 01/12/24  Yes [provider]  EPINEPHrine  (EPIPEN  2-PAK) 0.3 mg/0.3 mL IJ SOAJ injection Inject 0.3 mg into the muscle as needed for anaphylaxis. 03/19/22   Belenda Macario HERO, NP  fluticasone  (FLONASE ) 50 MCG/ACT nasal spray Place 1 spray into both nostrils daily. Begin by using 2 sprays in each nare daily for 3 to 5 days, then decrease to 1 spray in each nare daily. 06/18/23   Joesph Shaver Scales, PA-C  norethindrone -ethinyl estradiol-iron  (JUNEL FE 1.5/30) 1.5-30 MG-MCG tablet Take 1 tablet by mouth daily. 01/11/23   [provider]  Olopatadine  HCl (PATADAY ) 0.2 % SOLN Apply 1 drop to eye daily. 06/18/23   Joesph Shaver Scales, PA-C  triamcinolone  ointment (KENALOG ) 0.1 % Apply twice daily for flare ups below neck, maximum 10 days. 02/02/23   Tobie Arleta SQUIBB, MD    Family History Family History  Problem Relation Age of Onset   Allergic rhinitis Mother    Sickle cell trait Mother    Miscarriages / Stillbirths Mother    Thyroid  disease Maternal Aunt    Hypertension Maternal Grandmother    Hyperlipidemia Maternal Grandmother    Diabetes Maternal Grandfather    Hypertension Maternal Grandfather    Cancer  Maternal Grandfather        prostate   Hearing loss Maternal Freight forwarder exposure   Alcohol abuse Neg Hx    Arthritis Neg Hx    Asthma Neg Hx    Birth defects Neg Hx    COPD Neg Hx    Depression Neg Hx    Drug abuse Neg Hx    Early death Neg Hx    Heart disease Neg Hx    Kidney disease Neg Hx    Learning disabilities Neg Hx    Mental illness Neg Hx    Mental retardation Neg Hx    Stroke Neg Hx    Vision loss Neg Hx    Varicose Veins Neg Hx     Social History Social History   Tobacco Use    Smoking status: Never    Passive exposure: Current   Smokeless tobacco: Never  Vaping Use   Vaping status: Never Used  Substance Use Topics   Alcohol use: No   Drug use: No     Allergies   Pineapple, Other, Gramineae pollens, Latex, and Wool alcohol [lanolin]   Review of Systems Review of Systems   Physical Exam Triage Vital Signs ED Triage Vitals  Encounter Vitals Group     BP 05/29/24 1528 113/66     Girls Systolic BP Percentile --      Girls Diastolic BP Percentile --      Boys Systolic BP Percentile --      Boys Diastolic BP Percentile --      Pulse Rate 05/29/24 1528 73     Resp 05/29/24 1528 16     Temp 05/29/24 1528 98.3 F (36.8 C)     Temp Source 05/29/24 1528 Oral     SpO2 05/29/24 1528 98 %     Weight 05/29/24 1528 123 lb 0.3 oz (55.8 kg)     Height --      Head Circumference --      Peak Flow --      Pain Score 05/29/24 1527 9     Pain Loc --      Pain Education --      Exclude from Growth Chart --    No data found.  Updated Vital Signs BP 113/66 (BP Location: Left Arm)   Pulse 73   Temp 98.3 F (36.8 C) (Oral)   Resp 16   Wt 123 lb 0.3 oz (55.8 kg)   LMP 05/22/2024 (Exact Date)   SpO2 98%   BMI 21.79 kg/m   Visual Acuity Right Eye Distance:   Left Eye Distance:   Bilateral Distance:    Right Eye Near:   Left Eye Near:    Bilateral Near:     Physical Exam Vitals and nursing note reviewed.  Constitutional:      General: She is not in acute distress.    Appearance: Normal appearance. She is not ill-appearing, toxic-appearing or diaphoretic.  HENT:     Head:     Comments: Tenderness to palpation of occiput     Nose: Congestion (moderately enlarged turbinates) present. No rhinorrhea.     Right Sinus: No maxillary sinus tenderness or frontal sinus tenderness.     Left Sinus: No maxillary sinus tenderness or frontal sinus tenderness.     Comments: No tenderness to palpation    Mouth/Throat:     Mouth: Mucous membranes are moist.      Pharynx: Oropharynx is clear. No  oropharyngeal exudate or posterior oropharyngeal erythema.  Eyes:     General: No scleral icterus. Cardiovascular:     Rate and Rhythm: Normal rate and regular rhythm.     Heart sounds: Normal heart sounds.  Pulmonary:     Effort: Pulmonary effort is normal. No respiratory distress.     Breath sounds: Normal breath sounds. No wheezing or rhonchi.  Skin:    General: Skin is warm.  Neurological:     Mental Status: She is alert and oriented to person, place, and time.  Psychiatric:        Mood and Affect: Mood normal.        Behavior: Behavior normal.      UC Treatments / Results  Labs (all labs ordered are listed, but only abnormal results are displayed) Labs Reviewed - No data to display  EKG   Radiology No results found.  Procedures Procedures (including critical care time)  Medications Ordered in UC Medications  ketorolac  (TORADOL ) 30 MG/ML injection 30 mg (30 mg Intramuscular Given 05/29/24 1623)    Initial Impression / Assessment and Plan / UC Course  I have reviewed the triage vital signs and the nursing notes.  Pertinent labs & imaging results that were available during my care of the patient were reviewed by me and considered in my medical decision making (see chart for details).    Final Clinical Impressions(s) / UC Diagnoses   Final diagnoses:  Acute nonintractable headache, unspecified headache type  Tension headache     Discharge Instructions      You may take Tylenol  or ibuprofen for symptoms every 6-8 hrs as needed for pain along with meds prescribed     ED Prescriptions     Medication Sig Dispense Auth. Provider   fexofenadine -pseudoephedrine ([name]-D ALLERGY  & CONGESTION) 180-240 MG 24 hr tablet Take 1 tablet by mouth daily. 30 tablet Andra Krabbe C, PA-C   cyclobenzaprine  (FLEXERIL ) 5 MG tablet Take 1 tablet (5 mg total) by mouth 3 (three) times daily as needed. 30 tablet Andra Krabbe BROCKS,  PA-C      PDMP not reviewed this encounter.   Andra Krabbe BROCKS, PA-C 05/29/24 [id number]

## 2024-05-29 NOTE — Discharge Instructions (Addendum)
 You may take Tylenol  or ibuprofen for symptoms every 6-8 hrs as needed for pain along with meds prescribed

## 2024-05-31 ENCOUNTER — Other Ambulatory Visit: Payer: Self-pay

## 2024-05-31 ENCOUNTER — Encounter (HOSPITAL_COMMUNITY): Payer: Self-pay | Admitting: Emergency Medicine

## 2024-05-31 ENCOUNTER — Emergency Department (HOSPITAL_COMMUNITY)

## 2024-05-31 ENCOUNTER — Emergency Department (HOSPITAL_COMMUNITY)
Admission: EM | Admit: 2024-05-31 | Discharge: 2024-05-31 | Disposition: A | Discharge status: Home / Self Care | Attending: Student in an Organized Health Care Education/Training Program | Admitting: Student in an Organized Health Care Education/Training Program

## 2024-05-31 DIAGNOSIS — Z91040 Latex allergy status: Secondary | ICD-10-CM | POA: Diagnosis not present

## 2024-05-31 DIAGNOSIS — G43809 Other migraine, not intractable, without status migrainosus: Secondary | ICD-10-CM | POA: Insufficient documentation

## 2024-05-31 DIAGNOSIS — R519 Headache, unspecified: Secondary | ICD-10-CM | POA: Diagnosis present

## 2024-05-31 DIAGNOSIS — E860 Dehydration: Secondary | ICD-10-CM | POA: Diagnosis not present

## 2024-05-31 LAB — I-STAT CHEM 8, ED
BUN: 15 mg/dL (ref 6–20)
Calcium, Ion: 1.19 mmol/L (ref 1.15–1.40)
Chloride: 104 mmol/L (ref 98–111)
Creatinine, Ser: 1.1 mg/dL — ABNORMAL HIGH (ref 0.44–1.00)
Glucose, Bld: 79 mg/dL (ref 70–99)
HCT: 35 % — ABNORMAL LOW (ref 36.0–46.0)
Hemoglobin: 11.9 g/dL — ABNORMAL LOW (ref 12.0–15.0)
Potassium: 4.3 mmol/L (ref 3.5–5.1)
Sodium: 138 mmol/L (ref 135–145)
TCO2: 24 mmol/L (ref 22–32)

## 2024-05-31 MED ORDER — IOHEXOL 350 MG/ML SOLN
75.0000 mL | Freq: Once | INTRAVENOUS | Status: AC | PRN
  Administered 2024-05-31: 75 mL via INTRAVENOUS

## 2024-05-31 MED ORDER — PREDNISONE 10 MG PO TABS: 60.0000 mg | ORAL_TABLET | Freq: Every day | ORAL | 0 refills | Status: AC

## 2024-05-31 MED ORDER — LACTATED RINGERS IV BOLUS
1000.0000 mL | Freq: Once | INTRAVENOUS | Status: AC
  Administered 2024-05-31: 1000 mL via INTRAVENOUS

## 2024-05-31 MED ORDER — MORPHINE SULFATE (PF) 2 MG/ML IV SOLN
2.0000 mg | Freq: Once | INTRAVENOUS | Status: AC
  Administered 2024-05-31: 2 mg via INTRAVENOUS
  Filled 2024-05-31: qty 1

## 2024-05-31 MED ORDER — METOCLOPRAMIDE HCL 5 MG/ML IJ SOLN
10.0000 mg | Freq: Once | INTRAMUSCULAR | Status: AC
  Administered 2024-05-31: 10 mg via INTRAVENOUS
  Filled 2024-05-31: qty 2

## 2024-05-31 MED ORDER — PREDNISONE 20 MG PO TABS
60.0000 mg | ORAL_TABLET | Freq: Once | ORAL | Status: AC
  Administered 2024-05-31: 60 mg via ORAL
  Filled 2024-05-31: qty 3

## 2024-05-31 MED ORDER — METOCLOPRAMIDE HCL 10 MG PO TABS: 10.0000 mg | ORAL_TABLET | Freq: Four times a day (QID) | ORAL | 0 refills | Status: DC

## 2024-05-31 NOTE — Discharge Instructions (Addendum)
 We have sent Augmentin  and prednisone  to the pharmacy.   The Reglan  can help with your headaches and take this as needed. We gave you the first dose of prednisone  in the ED.  Please take the prednisone  as prescribed for the next 2 days

## 2024-05-31 NOTE — ED Provider Notes (Signed)
 [place] EMERGENCY DEPARTMENT AT [facility] Provider Note   CSN: [id number] Arrival date & time: 05/31/24  9058     Patient presents with: Migraine   GEORGANN BRAMBLE is a 23 y.o. female.   23 year old female with no reported past medical history presenting to the emergency department for intractable migraine x 1 week.  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  She does not have a known history of migraines, however they do have a reported family history.  She has had some nausea and decreased appetite along with her discomfort.  She reports intermittent photophobia and blurry vision with her migraine symptoms.  She was seen in urgent care several days ago where she received Toradol  and Flexeril  but did not appreciate much improvement in her symptoms.  She reports taking Tylenol , ibuprofen, and Excedrin at home without much improvement.  Her symptoms have been ongoing throughout the day and she does not appreciate any worsening in the morning.  She denies any fevers or development of any rashes.  Her childhood vaccines are up-to-date.  No known exposures at college.  Denying any recent trauma, illnesses, new medications or fevers.  She is on oral birth control.   Migraine       Prior to Admission medications   Medication Sig Start Date End Date Taking? Authorizing Provider  metoCLOPramide  (REGLAN ) 10 MG tablet Take 1 tablet (10 mg total) by mouth every 6 (six) hours. 05/31/24  Yes Joslin Doell, DO  predniSONE  (DELTASONE ) 10 MG tablet Take 6 tablets (60 mg total) by mouth daily for 2 days. 06/01/24 06/03/24 Yes Jacelynn Hayton, DO  cyclobenzaprine  (FLEXERIL ) 5 MG tablet Take 1 tablet (5 mg total) by mouth 3 (three) times daily as needed. 05/29/24   Andra Corean BROCKS, PA-C  EPINEPHrine  (EPIPEN  2-PAK) 0.3 mg/0.3 mL IJ SOAJ injection Inject 0.3 mg into the muscle as needed for anaphylaxis. 03/19/22   Klett, Macario HERO, NP   fexofenadine -pseudoephedrine ([name]-D ALLERGY  & CONGESTION) 180-240 MG 24 hr tablet Take 1 tablet by mouth daily. 05/29/24   Andra Corean BROCKS, PA-C  fluconazole  (DIFLUCAN ) 150 MG tablet Take by mouth. 01/12/24   [provider]  fluticasone  (FLONASE ) 50 MCG/ACT nasal spray Place 1 spray into both nostrils daily. Begin by using 2 sprays in each nare daily for 3 to 5 days, then decrease to 1 spray in each nare daily. 06/18/23   Joesph Shaver Scales, PA-C  norethindrone -ethinyl estradiol-iron  (JUNEL FE 1.5/30) 1.5-30 MG-MCG tablet Take 1 tablet by mouth daily. 01/11/23   [provider]  Olopatadine  HCl (PATADAY ) 0.2 % SOLN Apply 1 drop to eye daily. 06/18/23   Joesph Shaver Scales, PA-C  triamcinolone  ointment (KENALOG ) 0.1 % Apply twice daily for flare ups below neck, maximum 10 days. 02/02/23   Tobie Arleta SQUIBB, MD    Allergies: Pineapple, Other, Gramineae pollens, Latex, and Wool alcohol [lanolin]    Review of Systems  All other systems reviewed and are negative.   Updated Vital Signs BP 108/75   Pulse (!) 59   Temp 98.4 F (36.9 C)   Resp 17   Ht 5' 3 (1.6 m)   Wt 55.8 kg   LMP 05/22/2024 (Exact Date)   SpO2 100%   BMI 21.79 kg/m   Physical Exam Vitals and nursing note reviewed.  Constitutional:      Appearance: Normal appearance.  HENT:     Nose: Nose normal.     Mouth/Throat:  Mouth: Mucous membranes are moist.  Eyes:     Extraocular Movements: Extraocular movements intact.     Conjunctiva/sclera: Conjunctivae normal.     Pupils: Pupils are equal, round, and reactive to light.  Cardiovascular:     Rate and Rhythm: Normal rate.     Pulses: Normal pulses.  Pulmonary:     Effort: Pulmonary effort is normal.     Breath sounds: Normal breath sounds.  Abdominal:     General: Abdomen is flat.  Musculoskeletal:     Cervical back: Normal range of motion and neck supple. No rigidity.     Right lower leg: No edema.     Left lower leg: No edema.   Skin:    General: Skin is warm.     Findings: No rash.  Neurological:     General: No focal deficit present.     Mental Status: She is alert and oriented to person, place, and time.     Cranial Nerves: No cranial nerve deficit.     Motor: No weakness.     Gait: Gait normal.     (all labs ordered are listed, but only abnormal results are displayed) Labs Reviewed  I-STAT CHEM 8, ED - Abnormal; Notable for the following components:      Result Value   Creatinine, Ser 1.10 (*)    Hemoglobin 11.9 (*)    HCT 35.0 (*)    All other components within normal limits    EKG: None  Radiology: CT VENOGRAM HEAD Result Date: 05/31/2024 EXAM: CT VENOGRAM WITH CONTRAST 05/31/2024 01:46:00 PM TECHNIQUE: CT venogram of the head/brain was performed with the administration of intravenous contrast. Multiplanar reformatted images are provided for review. MIP images are provided for review. Automated exposure control, iterative reconstruction, and/or weight based adjustment of the mA/kV was utilized to reduce the radiation dose to as low as reasonably achievable. COMPARISON: None available. CLINICAL HISTORY: Intractable migraine with reported blurry vision/photophobia on OCPs FINDINGS: BRAIN/VENTRICLES: No acute intracranial hemorrhage. No extra axial fluid collection. Gray-white differentiation is maintained. No mass effect or midline shift. No hydrocephalus. ORBITS: No acute abnormality. SINUSES AND MASTOIDS: No acute abnormality. SOFT TISSUES AND SKULL: No acute abnormality. CT VENOGRAM: No dural venous sinus thrombosis. No significant stenosis. IMPRESSION: 1. No acute intracranial abnormality. 2. No dural venous sinus thrombosis. Electronically signed by: prentice spade 05/31/2024 03:11 PM EST RP Workstation: [id number]     Procedures   Medications Ordered in the ED  predniSONE  (DELTASONE ) tablet 60 mg (has no administration in time range)  lactated ringers  bolus 1,000 mL (0 mLs Intravenous Stopped  05/31/24 1431)  metoCLOPramide  (REGLAN ) injection 10 mg (10 mg Intravenous Given 05/31/24 1104)  morphine  (PF) 2 MG/ML injection 2 mg (2 mg Intravenous Given 05/31/24 1103)  iohexol  (OMNIPAQUE ) 350 MG/ML injection 75 mL (75 mLs Intravenous Contrast Given 05/31/24 1413)                                    Medical Decision Making 23 year old female presenting to the emergency department with intractable migraine x 1 week.  Differential includes but not limited to migraine, ICH, cavernous venous thrombosis, sinusitis, and others.  She has taken several medications and received multiple medications at urgent care without improvement.  Due to her reported vision changes and ongoing symptoms we will go ahead with a CT to rule out evidence of cavernous sinus thrombosis.  Physical exam including cranial nerves  were unremarkable.  Patient is at a higher risk due to being on oral OCPs.  We will also go ahead and provide IV fluids to rule out any underlying dehydration as the cause of her ongoing symptoms.  Pain control provided as well.  CTV was negative for any acute abnormalities.  Her i-STAT chemistry did show slight increase in her creatinine and I believe dehydration may be contributing to her headaches.  On reevaluation, her headache had slightly improved and family felt comfortable returning home at this time.  We did provide prednisone  and sent her home with 2 more days of prednisone .  Return precautions discussed.  Amount and/or Complexity of Data Reviewed Radiology: ordered.  Risk Prescription drug management.     Final diagnoses:  Other migraine without status migrainosus, not intractable  Dehydration    ED Discharge Orders          Ordered    predniSONE  (DELTASONE ) 10 MG tablet  Daily        05/31/24 1544    metoCLOPramide  (REGLAN ) 10 MG tablet  Every 6 hours        05/31/24 1544               Efrem Pitstick, DO 05/31/24 1547

## 2024-05-31 NOTE — ED Triage Notes (Signed)
 Pt reports migraines since last Monday with light sensitivity. No relief with OTC meds, including allergy  meds.

## 2024-06-06 DIAGNOSIS — G43001 Migraine without aura, not intractable, with status migrainosus: Secondary | ICD-10-CM | POA: Diagnosis not present

## 2024-06-24 ENCOUNTER — Encounter (HOSPITAL_BASED_OUTPATIENT_CLINIC_OR_DEPARTMENT_OTHER): Payer: Self-pay

## 2024-06-24 ENCOUNTER — Other Ambulatory Visit: Payer: Self-pay

## 2024-06-24 ENCOUNTER — Emergency Department (HOSPITAL_BASED_OUTPATIENT_CLINIC_OR_DEPARTMENT_OTHER)
Admission: EM | Admit: 2024-06-24 | Discharge: 2024-06-24 | Disposition: A | Discharge status: Home / Self Care | Attending: Emergency Medicine | Admitting: Emergency Medicine

## 2024-06-24 DIAGNOSIS — Z91040 Latex allergy status: Secondary | ICD-10-CM | POA: Insufficient documentation

## 2024-06-24 DIAGNOSIS — G43809 Other migraine, not intractable, without status migrainosus: Secondary | ICD-10-CM | POA: Diagnosis not present

## 2024-06-24 DIAGNOSIS — G43909 Migraine, unspecified, not intractable, without status migrainosus: Secondary | ICD-10-CM

## 2024-06-24 DIAGNOSIS — R519 Headache, unspecified: Secondary | ICD-10-CM | POA: Diagnosis present

## 2024-06-24 LAB — CBC WITH DIFFERENTIAL/PLATELET
Abs Immature Granulocytes: 0.02 K/uL (ref 0.00–0.07)
Basophils Absolute: 0 K/uL (ref 0.0–0.1)
Basophils Relative: 0 %
Eosinophils Absolute: 0.1 K/uL (ref 0.0–0.5)
Eosinophils Relative: 1 %
HCT: 37.4 % (ref 36.0–46.0)
Hemoglobin: 12.8 g/dL (ref 12.0–15.0)
Immature Granulocytes: 0 %
Lymphocytes Relative: 33 %
Lymphs Abs: 2 K/uL (ref 0.7–4.0)
MCH: 26.6 pg (ref 26.0–34.0)
MCHC: 34.2 g/dL (ref 30.0–36.0)
MCV: 77.8 fL — ABNORMAL LOW (ref 80.0–100.0)
Monocytes Absolute: 0.2 K/uL (ref 0.1–1.0)
Monocytes Relative: 3 %
Neutro Abs: 3.9 K/uL (ref 1.7–7.7)
Neutrophils Relative %: 63 %
Platelets: 336 K/uL (ref 150–400)
RBC: 4.81 MIL/uL (ref 3.87–5.11)
RDW: 13.2 % (ref 11.5–15.5)
WBC: 6.2 K/uL (ref 4.0–10.5)
nRBC: 0 % (ref 0.0–0.2)

## 2024-06-24 LAB — BASIC METABOLIC PANEL WITH GFR
Anion gap: 10 (ref 5–15)
BUN: 8 mg/dL (ref 6–20)
CO2: 26 mmol/L (ref 22–32)
Calcium: 9.5 mg/dL (ref 8.9–10.3)
Chloride: 103 mmol/L (ref 98–111)
Creatinine, Ser: 0.92 mg/dL (ref 0.44–1.00)
GFR, Estimated: >60 mL/min
Glucose, Bld: 91 mg/dL (ref 70–99)
Potassium: 3.9 mmol/L (ref 3.5–5.1)
Sodium: 139 mmol/L (ref 135–145)

## 2024-06-24 MED ORDER — KETOROLAC TROMETHAMINE 15 MG/ML IJ SOLN
15.0000 mg | Freq: Once | INTRAMUSCULAR | Status: AC
  Administered 2024-06-24: 15 mg via INTRAVENOUS
  Filled 2024-06-24: qty 1

## 2024-06-24 MED ORDER — SODIUM CHLORIDE 0.9 % IV BOLUS
1000.0000 mL | Freq: Once | INTRAVENOUS | Status: AC
  Administered 2024-06-24: 1000 mL via INTRAVENOUS

## 2024-06-24 MED ORDER — DEXAMETHASONE SOD PHOSPHATE PF 10 MG/ML IJ SOLN
10.0000 mg | Freq: Once | INTRAMUSCULAR | Status: AC
  Administered 2024-06-24: 10 mg via INTRAVENOUS

## 2024-06-24 MED ORDER — METOCLOPRAMIDE HCL 5 MG/ML IJ SOLN
10.0000 mg | Freq: Once | INTRAMUSCULAR | Status: AC
  Administered 2024-06-24: 10 mg via INTRAVENOUS
  Filled 2024-06-24: qty 2

## 2024-06-24 NOTE — ED Triage Notes (Signed)
 Pt has been having migraines for the past 4 weeks. Recently seen at ER 2.5 weeks ago, CT done at that time. Light sensitivity.

## 2024-06-24 NOTE — Discharge Instructions (Signed)
Follow-up with the neurologist as discussed.  Return to the emergency room if you have any worsening symptoms. 

## 2024-06-24 NOTE — ED Provider Notes (Signed)
 " [facility] Provider Note   CSN: [id number] Arrival date & time: 06/24/24  9177     Patient presents with: Migraine   Holly Goodman is a 23 y.o. female.   Patient is a 23 year old female who presents with a headache.  She says she has been having what she describes as a migrainous type headache for about 4 weeks.  It waxes and wanes in intensity.  Its mostly in the front of her head but she also has some pain in the posterior head and some tenseness in her neck muscles.  She does not have any pain that goes down her spine.  No pain that goes down her arms.  No numbness or weakness in her extremities.  She has some occasional light and sound sensitivity but no change in her vision.  No speech deficits.  No difficulty with her balance.  She was recently off her hormone replacement therapy and recently restarted it.  She followed up with her OB/GYN who felt that it may be hormonal related.  She was seen in the ED about a month ago and had a CT venogram that was negative.  She said overall she feels like her headaches are starting to improve but she still having them.  She said they are not as bad as they were when she had come to the ED the last time.  She denies any fevers.  She has had some nausea but no vomiting.       Prior to Admission medications  Medication Sig Start Date End Date Taking? Authorizing Provider  cyclobenzaprine  (FLEXERIL ) 5 MG tablet Take 1 tablet (5 mg total) by mouth 3 (three) times daily as needed. 05/29/24   Andra Corean BROCKS, PA-C  EPINEPHrine  (EPIPEN  2-PAK) 0.3 mg/0.3 mL IJ SOAJ injection Inject 0.3 mg into the muscle as needed for anaphylaxis. 03/19/22   Klett, Macario HERO, NP  fexofenadine -pseudoephedrine ([name]-D ALLERGY  & CONGESTION) 180-240 MG 24 hr tablet Take 1 tablet by mouth daily. 05/29/24   Andra Corean BROCKS, PA-C  fluconazole  (DIFLUCAN ) 150 MG tablet Take by mouth. 01/12/24   [provider]   fluticasone  (FLONASE ) 50 MCG/ACT nasal spray Place 1 spray into both nostrils daily. Begin by using 2 sprays in each nare daily for 3 to 5 days, then decrease to 1 spray in each nare daily. 06/18/23   Joesph Shaver Scales, PA-C  metoCLOPramide  (REGLAN ) 10 MG tablet Take 1 tablet (10 mg total) by mouth every 6 (six) hours. 05/31/24   Lowther, Amy, DO  norethindrone -ethinyl estradiol-iron  (JUNEL FE 1.5/30) 1.5-30 MG-MCG tablet Take 1 tablet by mouth daily. 01/11/23   [provider]  Olopatadine  HCl (PATADAY ) 0.2 % SOLN Apply 1 drop to eye daily. 06/18/23   Joesph Shaver Scales, PA-C  triamcinolone  ointment (KENALOG ) 0.1 % Apply twice daily for flare ups below neck, maximum 10 days. 02/02/23   Tobie Arleta SQUIBB, MD    Allergies: Pineapple, Other, Gramineae pollens, Latex, and Wool alcohol [lanolin]    Review of Systems  Constitutional:  Negative for chills, diaphoresis, fatigue and fever.  HENT:  Negative for congestion, rhinorrhea and sneezing.   Eyes: Negative.   Respiratory:  Negative for cough, chest tightness and shortness of breath.   Cardiovascular:  Negative for chest pain and leg swelling.  Gastrointestinal:  Positive for nausea. Negative for abdominal pain, blood in stool, diarrhea and vomiting.  Genitourinary:  Negative for difficulty urinating, flank pain and frequency.  Musculoskeletal:  Positive for myalgias. Negative for arthralgias and back pain.  Skin:  Negative for rash.  Neurological:  Positive for headaches. Negative for dizziness, speech difficulty, weakness and numbness.    Updated Vital Signs BP 127/79 (BP Location: Right Arm)   Pulse 93   Temp 98 F (36.7 C) (Oral)   Resp 16   Ht 5' 3 (1.6 m)   Wt 53.5 kg   LMP 05/22/2024 (Exact Date)   SpO2 100%   BMI 20.90 kg/m   Physical Exam Constitutional:      Appearance: She is well-developed.  HENT:     Head: Normocephalic and atraumatic.  Eyes:     Extraocular Movements: Extraocular movements intact.      Pupils: Pupils are equal, round, and reactive to light.     Comments: Unable to completely visualize fundi  Neck:     Comments: No meningismus, some soreness along the trapezius muscles bilaterally Cardiovascular:     Rate and Rhythm: Normal rate and regular rhythm.     Heart sounds: Normal heart sounds.  Pulmonary:     Effort: Pulmonary effort is normal. No respiratory distress.     Breath sounds: Normal breath sounds. No wheezing or rales.  Chest:     Chest wall: No tenderness.  Abdominal:     General: Bowel sounds are normal.     Palpations: Abdomen is soft.     Tenderness: There is no abdominal tenderness. There is no guarding or rebound.  Musculoskeletal:        General: Normal range of motion.     Cervical back: Normal range of motion and neck supple.  Lymphadenopathy:     Cervical: No cervical adenopathy.  Skin:    General: Skin is warm and dry.     Findings: No rash.  Neurological:     Mental Status: She is alert and oriented to person, place, and time.     Comments: Motor 5/5 all extremities Sensation grossly intact to LT all extremities Finger to Nose intact, no pronator drift CN II-XII grossly intact Gait normal      (all labs ordered are listed, but only abnormal results are displayed) Labs Reviewed  CBC WITH DIFFERENTIAL/PLATELET - Abnormal; Notable for the following components:      Result Value   MCV 77.8 (*)    All other components within normal limits  BASIC METABOLIC PANEL WITH GFR    EKG: None  Radiology: No results found.   Procedures   Medications Ordered in the ED  sodium chloride  0.9 % bolus 1,000 mL (1,000 mLs Intravenous New Bag/Given 06/24/24 0902)  dexamethasone  (DECADRON ) injection 10 mg (10 mg Intravenous Given 06/24/24 0859)  metoCLOPramide  (REGLAN ) injection 10 mg (10 mg Intravenous Given 06/24/24 0900)  ketorolac  (TORADOL ) 15 MG/ML injection 15 mg (15 mg Intravenous Given 06/24/24 0900)                                     Medical Decision Making Amount and/or Complexity of Data Reviewed Labs: ordered.  Risk Prescription drug management.   This patient presents to the ED for concern of headache, this involves an extensive number of treatment options, and is a complaint that carries with it a high risk of complications and morbidity.  I considered the following differential and admission for this acute, potentially life threatening condition.  The differential diagnosis includes migraine, tension headache, subarachnoid hemorrhage, meningitis, brain mass, dural  sinus thrombosis  MDM:    Patient is a 23 year old who presents with ongoing migrainous type headache.  At this point she says it is a dull aching pain.  It tends to get better throughout the day.  She does not have any meningismus or clinical concerns for subarachnoid hemorrhage or meningitis.  She does not have any neurologic deficits.  She had a recent CT venogram that was negative.  Overall her headaches are improving but she is concerned that they still have not resolved.  Patient is feeling much better after treatment here in the ED.  She says her headache is better.  She does not have any focal neurologic deficits.  However it is a little concerning that her headaches are worse in the morning when she first wakes up and could be related to a neck strain or other etiologies but she potentially will need an MRI.  Will put in a neurology referral.  She is overall feeling better and was discharged home in good condition.  Return precautions were given.  (Labs, imaging, consults)  Labs: I Ordered, and personally interpreted labs.  The pertinent results include: Normal creatinine, otherwise nonconcerning  Imaging Studies ordered: I ordered imaging studies including none I independently visualized and interpreted imaging. I agree with the radiologist interpretation  Additional history obtained from  .  External records from outside source obtained and  reviewed including prior notes  Cardiac Monitoring: The patient was maintained on a cardiac monitor.  If on the cardiac monitor, I personally viewed and interpreted the cardiac monitored which showed an underlying rhythm of: Sinus rhythm  Reevaluation: After the interventions noted above, I reevaluated the patient and found that they have :improved  Social Determinants of Health:  college student  Disposition: Discharged to home  Co morbidities that complicate the patient evaluation  Past Medical History:  Diagnosis Date   Eczema    Lacrimal duct stenosis [id number]   Mild persistent asthma with acute exacerbation 07/07/2012   Exacerbation off and on from 06/21/12 through 07/07/12   Sickle cell trait      Medicines Meds ordered this encounter  Medications   sodium chloride  0.9 % bolus 1,000 mL   dexamethasone  (DECADRON ) injection 10 mg   metoCLOPramide  (REGLAN ) injection 10 mg   ketorolac  (TORADOL ) 15 MG/ML injection 15 mg    I have reviewed the patients home medicines and have made adjustments as needed  Problem List / ED Course: Problem List Items Addressed This Visit   None Visit Diagnoses       Migraine without status migrainosus, not intractable, unspecified migraine type    -  Primary   Relevant Medications   ketorolac  (TORADOL ) 15 MG/ML injection 15 mg (Completed)   Other Relevant Orders   Ambulatory referral to Neurology                Final diagnoses:  Migraine without status migrainosus, not intractable, unspecified migraine type    ED Discharge Orders          Ordered    Ambulatory referral to Neurology       Comments: An appointment is requested in approximately: 1 week   06/24/24 1015               Lenor Hollering, MD 06/24/24 1018  "

## 2024-07-11 ENCOUNTER — Ambulatory Visit: Admitting: Family

## 2024-07-11 ENCOUNTER — Encounter: Payer: Self-pay | Admitting: Family

## 2024-07-11 VITALS — BP 108/74 | HR 80 | Temp 98.8°F | Resp 16 | Ht 63.0 in | Wt 120.8 lb

## 2024-07-11 DIAGNOSIS — J351 Hypertrophy of tonsils: Secondary | ICD-10-CM | POA: Diagnosis not present

## 2024-07-11 DIAGNOSIS — Z7689 Persons encountering health services in other specified circumstances: Secondary | ICD-10-CM

## 2024-07-11 DIAGNOSIS — G43909 Migraine, unspecified, not intractable, without status migrainosus: Secondary | ICD-10-CM

## 2024-07-11 NOTE — Progress Notes (Signed)
 Issues with migraine headaches

## 2024-07-11 NOTE — Progress Notes (Signed)
 "  Subjective:    Holly Goodman - 24 y.o. female MRN [id number]  Date of birth: [birth year]  HPI  Holly Goodman is to establish care.    Current issues and/or concerns: - Patient seen on 06/24/2024 (2 hours) at [facility] Emergency Department at [facility] for migraine without status migrainosus, not intractable, unspecified migraine type. Today patient denies red flag symptoms. States she usually skips placebo week on birth control so that she does not have a period. States recently she forgot to skip placebo and had a normal period and migraines began shortly after. States Gynecology prescribed Emgality which has given minimal relief. Patient confirms oral medications do not help with migraines. She is scheduled for an upcoming appointment with Neurology.  - Requests referral to ENT. States tonsils cause frequent throat infections and mucus drainage. Denies symptoms today in office.   ROS per HPI     Health Maintenance:  Health Maintenance Due  Topic Date Due   Hepatitis C Screening  Never done   Cervical Cancer Screening (Pap smear)  Never done   CHLAMYDIA SCREENING  06/17/2024     Past Medical History: Patient Active Problem List   Diagnosis Date Noted   Generalized anxiety disorder with panic attacks 02/17/2023   Social anxiety disorder 02/17/2023   Psychophysiological insomnia 02/17/2023   Vitamin D  deficiency 02/17/2023   Tonsillar hypertrophy 01/01/2023   Proteinuria 12/02/2022   Leukocytes in urine 12/02/2022   Encounter for well child check without abnormal findings 04/12/2017   BMI (body mass index), pediatric, 5% to less than 85% for age 44/02/2017      Social History   reports that she has never smoked. She has been exposed to tobacco smoke. She has never used smokeless tobacco. She reports that she does not drink alcohol and does not use drugs.   Family History  family history includes Allergic rhinitis in her mother; Cancer in her maternal  grandfather; Diabetes in her maternal grandfather; Hearing loss in her maternal grandfather; Hyperlipidemia in her maternal grandmother; Hypertension in her maternal grandfather and maternal grandmother; Miscarriages / Stillbirths in her mother; Sickle cell trait in her mother; Thyroid  disease in her maternal aunt.   Medications: reviewed and updated   Objective:   Physical Exam BP 108/74   Pulse 80   Temp 98.8 F (37.1 C) (Oral)   Resp 16   Ht 5' 3 (1.6 m)   Wt 120 lb 12.8 oz (54.8 kg)   SpO2 99%   BMI 21.40 kg/m   Physical Exam HENT:     Head: Normocephalic and atraumatic.     Nose: Nose normal.     Mouth/Throat:     Mouth: Mucous membranes are moist.     Pharynx: Oropharynx is clear.     Comments: Enlarged tonsils bilaterally.  Eyes:     Extraocular Movements: Extraocular movements intact.     Conjunctiva/sclera: Conjunctivae normal.     Pupils: Pupils are equal, round, and reactive to light.  Cardiovascular:     Rate and Rhythm: Normal rate and regular rhythm.     Pulses: Normal pulses.     Heart sounds: Normal heart sounds.  Pulmonary:     Effort: Pulmonary effort is normal.     Breath sounds: Normal breath sounds.  Musculoskeletal:        General: Normal range of motion.     Cervical back: Normal range of motion and neck supple.  Neurological:     General: No  focal deficit present.     Mental Status: She is alert and oriented to person, place, and time.  Psychiatric:        Mood and Affect: Mood normal.        Behavior: Behavior normal.    Assessment & Plan:  1. Encounter to establish care (Primary) - Patient presents today to establish care. During the interim follow-up with primary provider as scheduled.  - Return for annual physical examination, labs, and health maintenance. Arrive fasting meaning having no food for at least 8 hours prior to appointment. You may have only water or black coffee. Please take scheduled medications as normal.  2. Migraine  without status migrainosus, not intractable, unspecified migraine type - Continue present management as managed by established Gynecology.  - Keep all scheduled appointments with Neurology.   3. Enlarged tonsils - Referral to ENT for evaluation/management.  - Ambulatory referral to ENT    Patient was given clear instructions to go to Emergency Department or return to medical center if symptoms don't improve, worsen, or new problems develop.The patient verbalized understanding.  I discussed the assessment and treatment plan with the patient. The patient was provided an opportunity to ask questions and all were answered. The patient agreed with the plan and demonstrated an understanding of the instructions.   The patient was advised to call back or seek an in-person evaluation if the symptoms worsen or if the condition fails to improve as anticipated.    Greig Chute, NP 07/11/2024, 3:20 PM Primary Care at [facility]  "

## 2024-07-13 ENCOUNTER — Encounter: Payer: Self-pay | Admitting: Neurology

## 2024-07-13 ENCOUNTER — Ambulatory Visit: Admitting: Neurology

## 2024-07-13 VITALS — BP 115/79 | HR 85 | Ht 63.0 in | Wt 121.0 lb

## 2024-07-13 DIAGNOSIS — G43909 Migraine, unspecified, not intractable, without status migrainosus: Secondary | ICD-10-CM

## 2024-07-13 DIAGNOSIS — Z8669 Personal history of other diseases of the nervous system and sense organs: Secondary | ICD-10-CM

## 2024-07-13 DIAGNOSIS — R519 Headache, unspecified: Secondary | ICD-10-CM

## 2024-07-13 NOTE — Patient Instructions (Signed)
 It was nice to meet you today.   As discussed, your headaches are likely due to a combination of factors.   Here is what we discussed today and my recommendations for you:   Your neurological exam is normal thankfully. Please get an updated eye exam. You can seen any optometrist or ophthalmologist of your choosing. Strain on the eyes can exacerbate headaches. As far as ability to exercise, you can check with your primary care if you would benefit from a referral to sports medicine on this.  From my end of things there is no reason to restrict your exercise.  Modest exercise can actually help with headaches but overexertion can sometimes exacerbate headaches.  Your school may also have a physician affiliated with the athletic program at your [employer] that you can consult with. Please remember, common headache triggers are: sleep deprivation, dehydration, overheating, stress, hypoglycemia or skipping meals and blood sugar fluctuations, excessive pain medications or excessive alcohol use or caffeine withdrawal. Some people have food triggers such as aged cheese, orange juice or chocolate, especially dark chocolate, or MSG (monosodium glutamate). Try to avoid these headache triggers as much possible. It may be helpful to keep a headache diary to figure out what makes your headaches worse or brings them on and what alleviates them. Some people report headache onset after exercise but studies have shown that regular exercise may actually prevent headaches from coming. If you have exercise-induced headaches, please make sure that you drink plenty of fluid before and after exercising and that you do not over do it and do not overheat. Please avoid taking Advil on a regular basis, definitely avoid it daily as it can perpetuate headaches.  You can utilize Tylenol  as needed intermittently.   Use the Imitrex/sumatriptan only as needed for acute migraines which are typically one-sided, throbbing, associated with  light sensitivity or sound sensitivity and/or nausea or vomiting.   Increase your water intake to about 64 to 80 ounces of water per day, avoid caffeine, and alcohol.   I do not see a pressing reason to proceed with a brain scan such as an MRI.  Your exam is benign.   For headache prevention, you can continue with the Emgality injections that you just started through your GYN.  We will plan a follow up in this clinic for you to see one of our nurse practitioners in about 6 months.

## 2024-07-13 NOTE — Progress Notes (Addendum)
 Subjective:    Patient ID: Holly Goodman is a 24 y.o. female.  HPI    True Mar, MD, PhD [facility] [address], [address] [address] [place], [place] [address]  Dear Darice,  I saw your patient, Holly Goodman, upon your kind request in my neurologic clinic today for evaluation of her recurrent headaches, concern for migraines.  The patient is accompanied by her mother today.  As you know, Holly Goodman is a 24 year old female with an underlying medical history of eczema, asthma, and sickle cell trait,  who reports an approximately 23-month history of recurrent headaches.  Headaches are typically in the frontal and temporal areas, sometimes in the back and often constant and almost daily.  She also has had migrainous headaches associated with light sensitivity.  She has had intermittent blurry vision and sometimes it is hard to focus with her eyes but she has not had a recent eye examination.    Headache was mostly noticeable after she restarted her Junel.  Headaches started after menstrual cycle finished recently in November.   Stress may be a contributor to her headaches. Her dad has a history of migraines.  She has tried over-the-counter Advil, up to 600 mg but does not take it daily.  She has just started Emgality injections through your office about mid December and is due again this month.  I reviewed your office note from 06/06/2024.  She was started on low-dose sumatriptan at the time, 25 mg strength.  She has tried it but has not found it very effective.  She has no neurological accompaniments with her migraines or headaches.  No sudden onset of one-sided weakness or numbness or tingling or droopy face or slurring of speech.  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  She does not always hydrate very well, estimates that she drinks at least 2 16.9 ounce bottles of water per day, sometimes also drinks from her Stanley cup.  She does not drink  caffeine daily, occasional soda.  She does not drink any alcohol, she does not smoke or use any vaping device and does not utilize any THC or CBD products.  She has not had any double vision or loss of vision and she has not fallen although she feels sometimes she has had some weakness in her legs, not to the point of legs giving out. She tries to get enough sleep, bedtime is generally between 9 and 10 and rise time around 6:30 AM. She does not snore and no apneas have been reported.  She is in college at [employer]. Of note, she is currently not on sertraline  although she had a prescription for it and picked it up.  She never really started it.  Since she does have some stress and recurrent headaches, sometimes a low-dose of an antidepressant may not be a bad choice and she is encouraged to talk to you about it again.  Her Past Medical History Is Significant For: Past Medical History:  Diagnosis Date   Eczema    Lacrimal duct stenosis [id number]   Mild persistent asthma with acute exacerbation 07/07/2012   Exacerbation off and on from 06/21/12 through 07/07/12   Sickle cell trait     Her Past Surgical History Is Significant For: History reviewed. No pertinent surgical history.  Her Family History Is Significant For: Family History  Problem Relation Age of Onset   Allergic rhinitis Mother    Sickle cell trait Mother  Miscarriages / Stillbirths Mother    Migraines Father    Hypertension Maternal Grandmother    Hyperlipidemia Maternal Grandmother    Diabetes Maternal Grandfather    Hypertension Maternal Grandfather    Cancer Maternal Grandfather        prostate   Hearing loss Maternal Freight forwarder exposure   Thyroid  disease Maternal Aunt    Alcohol abuse Neg Hx    Arthritis Neg Hx    Asthma Neg Hx    Birth defects Neg Hx    COPD Neg Hx    Depression Neg Hx    Drug abuse Neg Hx    Early death Neg Hx    Heart disease Neg Hx    Kidney disease Neg Hx     Learning disabilities Neg Hx    Mental illness Neg Hx    Mental retardation Neg Hx    Stroke Neg Hx    Vision loss Neg Hx    Varicose Veins Neg Hx    Seizures Neg Hx    Sleep apnea Neg Hx     Her Social History Is Significant For: Social History   Socioeconomic History   Marital status: Single    Spouse name: Not on file   Number of children: Not on file   Years of education: Not on file   Highest education level: Not on file  Occupational History   Not on file  Tobacco Use   Smoking status: Never    Passive exposure: Current   Smokeless tobacco: Never  Vaping Use   Vaping status: Never Used  Substance and Sexual Activity   Alcohol use: No   Drug use: No   Sexual activity: Yes    Birth control/protection: Pill    Comment: Last encounter: 2nd week of July 2024  Other Topics Concern   Not on file  Social History Narrative   Sophomore at [employer]   Runs Track   Caffeine - none    Social Drivers of Health   Tobacco Use: Medium Risk (07/13/2024)   Patient History    Smoking Tobacco Use: Never    Smokeless Tobacco Use: Never    Passive Exposure: Current  Financial Resource Strain: Low Risk (07/11/2024)   Overall Financial Resource Strain (CARDIA)    Difficulty of Paying Living Expenses: Not hard at all  Food Insecurity: No Food Insecurity (07/11/2024)   Epic    Worried About Radiation Protection Practitioner of Food in the Last Year: Never true    Ran Out of Food in the Last Year: Never true  Transportation Needs: No Transportation Needs (07/11/2024)   Epic    Lack of Transportation (Medical): No    Lack of Transportation (Non-Medical): No  Physical Activity: Sufficiently Active (07/11/2024)   Exercise Vital Sign    Days of Exercise per Week: 7 days    Minutes of Exercise per Session: 30 min  Stress: No Stress Concern Present (07/11/2024)   [employer] of Occupational Health - Occupational Stress Questionnaire    Feeling of Stress: Only a little  Social Connections:  Moderately Integrated (07/11/2024)   Social Connection and Isolation Panel    Frequency of Communication with Friends and Family: More than three times a week    Frequency of Social Gatherings with Friends and Family: More than three times a week    Attends Religious Services: More than 4 times per year    Active Member of [employer] or Organizations: Yes  Attends Banker Meetings: More than 4 times per year    Marital Status: Never married  Depression (PHQ2-9): Low Risk (07/11/2024)   Depression (PHQ2-9)    PHQ-2 Score: 0  Alcohol Screen: Low Risk (07/11/2024)   Alcohol Screen    Last Alcohol Screening Score (AUDIT): 0  Housing: Unknown (07/11/2024)   Epic    Unable to Pay for Housing in the Last Year: No    Number of Times Moved in the Last Year: Not on file    Homeless in the Last Year: No  Utilities: Not At Risk (07/11/2024)   Epic    Threatened with loss of utilities: No  Health Literacy: Adequate Health Literacy (07/11/2024)   B1300 Health Literacy    Frequency of need for help with medical instructions: Never    Her Allergies Are:  Allergies[1]:   Her Current Medications Are:  Outpatient Encounter Medications as of 07/13/2024  Medication Sig   AUROVELA FE 1/20 1-20 MG-MCG tablet Take 1 tablet by mouth daily.   EPINEPHrine  (EPIPEN  2-PAK) 0.3 mg/0.3 mL IJ SOAJ injection Inject 0.3 mg into the muscle as needed for anaphylaxis.   fexofenadine -pseudoephedrine ([name]-D ALLERGY  & CONGESTION) 180-240 MG 24 hr tablet Take 1 tablet by mouth daily.   fluticasone  (FLONASE ) 50 MCG/ACT nasal spray Place 1 spray into both nostrils daily. Begin by using 2 sprays in each nare daily for 3 to 5 days, then decrease to 1 spray in each nare daily.   Olopatadine  HCl (PATADAY ) 0.2 % SOLN Apply 1 drop to eye daily.   triamcinolone  ointment (KENALOG ) 0.1 % Apply twice daily for flare ups below neck, maximum 10 days.   cyclobenzaprine  (FLEXERIL ) 5 MG tablet Take 1 tablet (5 mg total) by mouth 3  (three) times daily as needed. (Patient not taking: Reported on 07/13/2024)   fluconazole  (DIFLUCAN ) 150 MG tablet Take by mouth. (Patient not taking: Reported on 07/13/2024)   metoCLOPramide  (REGLAN ) 10 MG tablet Take 1 tablet (10 mg total) by mouth every 6 (six) hours. (Patient not taking: Reported on 07/13/2024)   norethindrone -ethinyl estradiol-iron  (JUNEL FE 1.5/30) 1.5-30 MG-MCG tablet Take 1 tablet by mouth daily. (Patient not taking: Reported on 07/13/2024)   No facility-administered encounter medications on file as of 07/13/2024.  :   Review of Systems:  Out of a complete 14 point review of systems, all are reviewed and negative with the exception of these symptoms as listed below:  Review of Systems  Objective:  Neurological Exam  Physical Exam Physical Examination:   Vitals:   07/13/24 1045  BP: 115/79  Pulse: 85    General Examination: The patient is a very pleasant 24 y.o. female in no acute distress. She appears well-developed and well-nourished and well groomed.   HEENT: Normocephalic, atraumatic, pupils are equal, round and reactive to light, extraocular tracking is good without limitation to gaze excursion or nystagmus noted. No photophobia.  Funduscopic exam benign.  No Corrective eye glasses in place. Hearing is grossly intact.  Face is symmetric with normal facial animation. Speech is clear without dysarthria. There is no hypophonia. There is no lip, neck/head, jaw or voice tremor. Neck is supple with full range of passive and active motion. There are no carotid bruits on auscultation.  Airway/Oropharynx exam reveals: Mild mouth dryness, good dental hygiene, tongue protrudes centrally and palate elevates symmetrically.   Chest: Clear to auscultation without wheezing, rhonchi or crackles noted.  Heart: S1+S2+0, regular and normal without murmurs, rubs or gallops noted.  Abdomen: Soft, non-tender and non-distended.  Extremities: There is no pitting edema in the distal  lower extremities bilaterally.   Skin: Warm and dry without trophic changes noted.   Musculoskeletal: exam reveals no obvious joint deformities.   Neurologically:  Mental status: The patient is awake, alert and oriented in all 4 spheres. Her immediate and remote memory, attention, language skills and fund of knowledge are appropriate. There is no evidence of aphasia, agnosia, apraxia or anomia. Speech is clear with normal prosody and enunciation. Thought process is linear. Mood is normal and affect is normal.  Cranial nerves II - XII are as described above under HEENT exam.  Motor exam: Normal bulk, strength and tone is noted. There is no obvious action or resting tremor.  No drift or rebound, no postural or intention tremor. Fine motor skills and coordination: Intact fine motor skills in both upper and lower extremities.  Cerebellar testing: No dysmetria or intention tremor. There is no truncal or gait ataxia.  No dysdiadochokinesia.  Normal heel-to-shin. Reflexes 2+ throughout, toes are downgoing bilaterally. Romberg negative. Sensory exam: intact to light touch in the upper and lower extremities.  Gait, station and balance: She stands easily. No veering to one side is noted. No leaning to one side is noted. Posture is age-appropriate and stance is narrow based. Gait shows normal stride length and normal pace. No problems turning are noted.  Normal tandem walk.  Assessment and Plan:  In summary, Holly Goodman is a 24 year old female with an underlying medical history of eczema, asthma, and sickle cell trait,  who presents for evaluation of her recurrent headaches of approximately 2 months duration with some features of migraine headaches and some tension headaches, altogether probably a mixed type of picture.  Causes may include stress, suboptimal hydration, strain on the eyes and also menstrual cycle/hormonal changes.  I had a long discussion with the patient and her mother regarding headache  causes, alleviating factors and medical management.  She is currently on a reasonably good regimen with sumatriptan as needed for migraine attacks or acute migraines and a preventative namely Emgality once a month injection [place] which she just started less than a month ago.  We talked about the importance of lifestyle modification and getting enough sleep, staying well-hydrated, being up-to-date with eye examination.  Mom had a question about her ability to participate in track.  I suggested that she monitor her symptoms, I currently do not have any neurological pertinent reason to restrict her ability to pursue her athletic activities.  She is advised to stay better hydrated and she may also consult with a physician that associated with her athletic program at her school or talk to her PCP about seeing a sports medicine specialist if need be.  From mind of things, I do not see a reason to proceed with a brain scan at this time.  She has had intermittent blurry vision and feels like she has a hard time focusing at times.  She is advised that strain on the eyes can perpetuate headaches and is advised to get a formal, full eye examination with any optometrist or ophthalmologist of her choosing.   Below is a summary of my recommendations and our discussion points from today's visit, based on chart review, history and examination. They were given these instructions verbally during the visit in detail and also in writing in the MyChart after visit summary (AVS), which they can access electronically. <<  Your neurological exam is normal thankfully. Please get  an updated eye exam. You can seen any optometrist or ophthalmologist of your choosing. Strain on the eyes can exacerbate headaches. As far as ability to exercise, you can check with your primary care if you would benefit from a referral to sports medicine on this.  From my end of things there is no reason to restrict your exercise.  Modest exercise can actually  help with headaches but overexertion can sometimes exacerbate headaches.  Your school may also have a physician affiliated with the athletic program at your [place] that you can consult with. Please remember, common headache triggers are: sleep deprivation, dehydration, overheating, stress, hypoglycemia or skipping meals and blood sugar fluctuations, excessive pain medications or excessive alcohol use or caffeine withdrawal. Some people have food triggers such as aged cheese, orange juice or chocolate, especially dark chocolate, or MSG (monosodium glutamate). Try to avoid these headache triggers as much possible. It may be helpful to keep a headache diary to figure out what makes your headaches worse or brings them on and what alleviates them. Some people report headache onset after exercise but studies have shown that regular exercise may actually prevent headaches from coming. If you have exercise-induced headaches, please make sure that you drink plenty of fluid before and after exercising and that you do not over do it and do not overheat. Please avoid taking Advil on a regular basis, definitely avoid it daily as it can perpetuate headaches.  You can utilize Tylenol  as needed intermittently.   Use the Imitrex/sumatriptan only as needed for acute migraines which are typically one-sided, throbbing, associated with light sensitivity or sound sensitivity and/or nausea or vomiting.   Increase your water intake to about 64 to 80 ounces of water per day, avoid caffeine, and alcohol.   I do not see a pressing reason to proceed with a brain scan such as an MRI.  Your exam is benign.   For headache prevention, you can continue with the Emgality injections that you just started through your GYN.  We will plan a follow up in this clinic for you to see one of our nurse practitioners in about 6 months.  >>    Thank you very much for allowing me to participate in the care of this nice patient. If I can be of any  further assistance to you please do not hesitate to call me at [phone].  Sincerely,   True Mar, MD, PhD     [1]  Allergies Allergen Reactions   Pineapple Anaphylaxis   Other Hives, Swelling and Rash    Canoa Oil   Gramineae Pollens Itching   Latex Itching   Wool Alcohol [Lanolin] Itching

## 2024-07-14 ENCOUNTER — Encounter: Payer: Self-pay | Admitting: Neurology

## 2024-07-14 ENCOUNTER — Telehealth: Payer: Self-pay | Admitting: Neurology

## 2024-07-14 NOTE — Telephone Encounter (Signed)
 Pt states she failed to ask during her appointment if there are other migraine medications/ injections she could be considered for, please call.

## 2024-07-15 ENCOUNTER — Encounter: Payer: Self-pay | Admitting: Family

## 2024-07-17 ENCOUNTER — Ambulatory Visit: Payer: Self-pay | Admitting: Family

## 2024-07-17 NOTE — Telephone Encounter (Signed)
 Copied from CRM #[id number]. Topic: Clinical - Medical Advice >> Jul 17, 2024 11:46 AM Tinnie BROCKS wrote: Reason for CRM: Pt requesting call back from [facility] or her nurse regarding message she received from neurology and recent mychart message. Her neurologist said  I recommend that you talk to your primary care about neck pain and seeing a spine specialist - she can make a referral if need be.  A spine specialist may consider you for a nerve block, if you are a candidate. Please see recent mychart message. Does pt need appt? Please return call at [phone]

## 2024-07-17 NOTE — Telephone Encounter (Signed)
 Spoke to patient gave Dr Amelia recommendations

## 2024-07-17 NOTE — Telephone Encounter (Signed)
 Spoke to patient gave Dr Amelia recommendations Pt thanked me for calling

## 2024-07-17 NOTE — Telephone Encounter (Signed)
 Pt has returned call to CMA, please call pt back.

## 2024-07-17 NOTE — Telephone Encounter (Signed)
 LVM for patient to call back and discuss Dr Buck recommendations

## 2024-07-17 NOTE — Telephone Encounter (Signed)
 Please see AVS, and we discussed management at length during the appointment as well.  I would advise her to continue with Emgality injections as she was recently started on these, had only had 1 injection less than a month ago. She was also advised to talk to her PCP about her antidepressant medication which she never started, as this can also help headaches.  We talked about this during the appointment as well.  Her mom was with her.  << Your neurological exam is normal thankfully. Please get an updated eye exam. You can seen any optometrist or ophthalmologist of your choosing. Strain on the eyes can exacerbate headaches. As far as ability to exercise, you can check with your primary care if you would benefit from a referral to sports medicine on this.  From my end of things there is no reason to restrict your exercise.  Modest exercise can actually help with headaches but overexertion can sometimes exacerbate headaches.  Your school may also have a physician affiliated with the athletic program at your [employer] that you can consult with. Please remember, common headache triggers are: sleep deprivation, dehydration, overheating, stress, hypoglycemia or skipping meals and blood sugar fluctuations, excessive pain medications or excessive alcohol use or caffeine withdrawal. Some people have food triggers such as aged cheese, orange juice or chocolate, especially dark chocolate, or MSG (monosodium glutamate). Try to avoid these headache triggers as much possible. It may be helpful to keep a headache diary to figure out what makes your headaches worse or brings them on and what alleviates them. Some people report headache onset after exercise but studies have shown that regular exercise may actually prevent headaches from coming. If you have exercise-induced headaches, please make sure that you drink plenty of fluid before and after exercising and that you do not over do it and do not overheat. Please avoid  taking Advil on a regular basis, definitely avoid it daily as it can perpetuate headaches.  You can utilize Tylenol  as needed intermittently.   Use the Imitrex/sumatriptan only as needed for acute migraines which are typically one-sided, throbbing, associated with light sensitivity or sound sensitivity and/or nausea or vomiting.   Increase your water intake to about 64 to 80 ounces of water per day, avoid caffeine, and alcohol.   I do not see a pressing reason to proceed with a brain scan such as an MRI.  Your exam is benign.   For headache prevention, you can continue with the Emgality injections that you just started through your GYN.  We will plan a follow up in this clinic for you to see one of our nurse practitioners in about 6 months. >>

## 2024-07-18 ENCOUNTER — Encounter: Payer: Self-pay | Admitting: Physical Therapy

## 2024-07-18 ENCOUNTER — Other Ambulatory Visit: Payer: Self-pay

## 2024-07-18 ENCOUNTER — Ambulatory Visit: Attending: Obstetrics and Gynecology | Admitting: Physical Therapy

## 2024-07-18 DIAGNOSIS — M62838 Other muscle spasm: Secondary | ICD-10-CM | POA: Diagnosis not present

## 2024-07-18 DIAGNOSIS — G4489 Other headache syndrome: Secondary | ICD-10-CM | POA: Insufficient documentation

## 2024-07-18 DIAGNOSIS — G4486 Cervicogenic headache: Secondary | ICD-10-CM | POA: Diagnosis not present

## 2024-07-18 DIAGNOSIS — M542 Cervicalgia: Secondary | ICD-10-CM | POA: Diagnosis not present

## 2024-07-18 NOTE — Therapy (Signed)
 " OUTPATIENT PHYSICAL THERAPY CERVICAL EVALUATION   Patient Name: Holly Goodman MRN: [id number] DOB:[birth year], 24 y.o., female Today's Date: 07/18/2024  END OF SESSION:  PT End of Session - 07/18/24 0806     Visit Number 1    Date for Recertification  10/10/24    Authorization Type [employer] will submit    PT Start Time 0803    PT Stop Time 0844    PT Time Calculation (min) 41 min    Activity Tolerance Patient tolerated treatment well          Past Medical History:  Diagnosis Date   Eczema    Lacrimal duct stenosis [id number]   Mild persistent asthma with acute exacerbation 07/07/2012   Exacerbation off and on from 06/21/12 through 07/07/12   Sickle cell trait    History reviewed. No pertinent surgical history. Patient Active Problem List   Diagnosis Date Noted   Generalized anxiety disorder with panic attacks 02/17/2023   Social anxiety disorder 02/17/2023   Psychophysiological insomnia 02/17/2023   Vitamin D  deficiency 02/17/2023   Tonsillar hypertrophy 01/01/2023   Proteinuria 12/02/2022   Leukocytes in urine 12/02/2022   Encounter for well child check without abnormal findings 04/12/2017   BMI (body mass index), pediatric, 5% to less than 85% for age 12/11/2016    PCP: Jaycee No NP  REFERRING PROVIDER: Marne Marseille MD  REFERRING DIAG: G44.89 headache syndrome  THERAPY DIAG:  Headache; neck pain; weakness  Rationale for Evaluation and Treatment: Rehabilitation  ONSET DATE: November   SUBJECTIVE:                                                                                                                                                                                                         SUBJECTIVE STATEMENT: Migraines started in November. Thought it was the result of hormones/birth control; Migraine or anti-inflammatory meds didn't help.  Saw massage therapist and that helped some and plans tosee him again.  December more like migraines now less  intense constant/non-stop headaches daily.  College classes start tomorrow (psychology major)  [employer]  track  (season Dec-May) missed weight training b/c of this problem, unable to participate at this time   PERTINENT HISTORY:  healthy  PAIN:   Are you having pain? Yes NPRS scale: 8/10 Pain location: frontal and also a weight in the back of my head, some neck pain (especially when I go to bed) Pain orientation: Bilateral  PAIN TYPE: aching Pain description: constant  Aggravating factors: when I first go to bed, pain wakes me up  early, as the day goes on; stopped running track; concerned about using laptop screen Relieving factors: Icy Hot, cold compress, hot compress PRECAUTIONS: None   WEIGHT BEARING RESTRICTIONS: No  FALLS:  Has patient fallen in last 6 months? No   OCCUPATION: student  PLOF: Independent  PATIENT GOALS: head pain to stop; stopped running track at school  NEXT MD VISIT: as needed with Dr. Marne  OBJECTIVE:  Note: Objective measures were completed at Evaluation unless otherwise noted.  DIAGNOSTIC FINDINGS:  CT scan at the hospital normal in Nov  PATIENT SURVEYS:  NDI:  NECK DISABILITY INDEX  Date: 1/13 Score  Pain intensity 1 = The pain is very mild at the moment  2. Personal care (washing, dressing, etc.) 2 = It is painful to look after myself and I am slow and careful  3. Lifting 3 = Pain prevents me from lifting heavy weights but I can manage light to medium   weights if they are conveniently positioned  4. Reading 2 =  I can read as much as I want with moderate pain in my neck  5. Headaches 5 = I have headaches almost all the time  6. Concentration 2 = I have a fair degree of difficulty in concentrating when I want to  7. Work 2 = I can do most of my usual work, but no more  8. Driving 2 =  I can drive my car as long as I want with moderate pain in my neck  9. Sleeping 3 =  My sleep is moderately disturbed (2-3 hrs sleepless)  10. Recreation  5 = I can't do any recreation activities at all  Total 56%   Minimum Detectable Change (90% confidence): 5 points or 10% points  COGNITION: Overall cognitive status: Within functional limits for tasks assessed   POSTURE: rounded shoulders  PALPATION: Taut bands bil upper traps, cervical paraspinals, suboccipitals   CERVICAL ROM:   Active ROM A/PROM (deg) eval  Flexion 60 pain  Extension 55  Right lateral flexion 35  Left lateral flexion 40  Right rotation 38  Left rotation 45   (Blank rows = not tested)  Deep cervical flexors and extensor strength grossly 4/5  UPPER EXTREMITY ROM: WFLS no pain UPPER EXTREMITY MMT: WFLS CERVICAL SPECIAL TESTS:  + response to cervical distraction seated and supine  TREATMENT DATE: 07/18/24 evaluation   Anti inflammatory strategies Initial HEP as below                                                                                                                               PATIENT EDUCATION:  Education details: Educated patient on anatomy and physiology of current symptoms, prognosis, plan of care as well as initial self care strategies to promote recovery Person educated: Patient Education method: Explanation Education comprehension: verbalized understanding  HOME EXERCISE PROGRAM: Access Code: [email or web address] URL: [email or web address] Date: 07/18/2024 Prepared by: Glade Pesa  Exercises - Seated Cervical Retraction  -  2-3 x daily - 7 x weekly - 1 sets - 10 reps - Standing Cervical Flexion Stretch  - 1 x daily - 7 x weekly - 1 sets - 3 reps - 20 hold - Seated Assisted Cervical Rotation with Towel  - 1-2 x daily - 7 x weekly - 1 sets - 10 reps  ASSESSMENT:  CLINICAL IMPRESSION: Patient is a 24 y.o. female who was seen today for physical therapy evaluation and treatment for headache syndrome. The patient would benefit from skilled PT to address decreased cervical range of motion, correct muscle strength asymmetries  and weakness in deep cervical flexors and extensors, improve postural strength including and scapular retractor and depressors and address myofascial pain.   All affect patient's ability to perform  ADLS including reading, using the computer, driving, sleeping and lifting.  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   OBJECTIVE IMPAIRMENTS: decreased activity tolerance, decreased ROM, decreased strength, increased fascial restrictions, impaired perceived functional ability, postural dysfunction, and pain.   ACTIVITY LIMITATIONS: lifting, sleeping, and hygiene/grooming  PARTICIPATION LIMITATIONS: driving, community activity, and school  PERSONAL FACTORS: good health positively affecting patient's functional outcome.   REHAB POTENTIAL: Good  CLINICAL DECISION MAKING: Stable/uncomplicated  EVALUATION COMPLEXITY: Low   GOALS: Goals reviewed with patient? Yes  SHORT TERM GOALS: Target date: 08/29/2024     The patient will demonstrate knowledge of basic self care strategies and exercises to promote healing  Baseline:  Goal status: INITIAL  2.  The patient will report a  40% improvement in pain levels with functional activities which are currently difficult including lifting, sleeping, driving and reading/looking at her laptop Baseline:  Goal status: INITIAL  3.  Decreased headache frequency and intensity by 40% Baseline:  Goal status: INITIAL  4.  Improved cervical rotation to 50 degrees needed for driving Baseline:  Goal status: INITIAL      LONG TERM GOALS: Target date: 10/10/2024      The patient will be independent in a safe self progression of a home exercise program to promote further recovery of function  Baseline:  Goal status: INITIAL  2.  The patient will report a  75% improvement in pain levels with functional activities which are  currently difficult including lifting, sleeping, driving and reading/looking at her laptop Baseline:  Goal status: INITIAL  3.  Decreased headache frequency and intensity by 60% Baseline:  Goal status: INITIAL  4.   Improved cervical rotation and sidebending to 60 degrees needed for driving Baseline:  Goal status: INITIAL  5.  Neck Disability Index improved to  42  % indicating improved function with less pain Baseline:  Goal status: INITIAL    PLAN:  PT FREQUENCY: 1x/week  PT DURATION: 12 weeks  PLANNED INTERVENTIONS: 97164- PT Re-evaluation, 97110-Therapeutic exercises, 97530- Therapeutic activity, 97112- Neuromuscular re-education, 97535- Self Care, 02859- Manual therapy, G0283- Electrical stimulation (unattended), [phone]- Electrical stimulation (manual), [id number]- Ultrasound, [id number]- Traction (mechanical), [id number]- Ionotophoresis 4mg /ml Dexamethasone , 79439 (1-2 muscles), 20561 (3+ muscles)- Dry Needling, Patient/Family education, Joint mobilization, Spinal manipulation, Spinal mobilization, Cryotherapy, and Moist heat  PLAN FOR NEXT SESSION: DN to bil cervical multifidi, bil upper traps and suboccipitals if pt agreeable; review and progress HEP- add upper trap stretching and masseter relaxation; manual cervical techniques  Glade Pesa, PT 07/18/2024 9:08 AM Phone: [phone] Fax: [phone]         "

## 2024-07-20 NOTE — Telephone Encounter (Signed)
 Please advise

## 2024-07-21 ENCOUNTER — Other Ambulatory Visit: Payer: Self-pay | Admitting: Family

## 2024-07-21 DIAGNOSIS — M542 Cervicalgia: Secondary | ICD-10-CM

## 2024-07-21 NOTE — Telephone Encounter (Signed)
 Call patient with update. Referral to Orthopedics for evaluation/management. Expect call soon with appointment details.

## 2024-07-24 ENCOUNTER — Ambulatory Visit: Admitting: Rehabilitative and Restorative Service Providers"

## 2024-07-24 ENCOUNTER — Encounter: Payer: Self-pay | Admitting: Rehabilitative and Restorative Service Providers"

## 2024-07-24 DIAGNOSIS — M542 Cervicalgia: Secondary | ICD-10-CM

## 2024-07-24 DIAGNOSIS — G4489 Other headache syndrome: Secondary | ICD-10-CM | POA: Diagnosis not present

## 2024-07-24 DIAGNOSIS — G4486 Cervicogenic headache: Secondary | ICD-10-CM

## 2024-07-24 DIAGNOSIS — M62838 Other muscle spasm: Secondary | ICD-10-CM

## 2024-07-24 NOTE — Therapy (Signed)
 " OUTPATIENT PHYSICAL THERAPY CERVICAL EVALUATION   Patient Name: Holly Goodman MRN: [id number] DOB:06/07/2001, 24 y.o., female Today's Date: 07/24/2024  END OF SESSION:  PT End of Session - 07/24/24 0742     Visit Number 2    Date for Recertification  10/10/24    Authorization Type [facility] 07/18/24-09/15/2024-, auth# [id number]    Authorization - Visit Number 1    Authorization - Number of Visits 6    PT Start Time 0735    PT Stop Time 0813    PT Time Calculation (min) 38 min    Activity Tolerance Patient tolerated treatment well    Behavior During Therapy [facility] for tasks assessed/performed          Past Medical History:  Diagnosis Date   Eczema    Lacrimal duct stenosis [id number]   Mild persistent asthma with acute exacerbation 07/07/2012   Exacerbation off and on from 06/21/12 through 07/07/12   Sickle cell trait    History reviewed. No pertinent surgical history. Patient Active Problem List   Diagnosis Date Noted   Generalized anxiety disorder with panic attacks 02/17/2023   Social anxiety disorder 02/17/2023   Psychophysiological insomnia 02/17/2023   Vitamin D  deficiency 02/17/2023   Tonsillar hypertrophy 01/01/2023   Proteinuria 12/02/2022   Leukocytes in urine 12/02/2022   Encounter for well child check without abnormal findings 04/12/2017   BMI (body mass index), pediatric, 5% to less than 85% for age 42/02/2017    PCP: Jaycee No NP  REFERRING PROVIDER: Marne Marseille MD  REFERRING DIAG: G44.89 headache syndrome  THERAPY DIAG:  Headache; neck pain; weakness  Rationale for Evaluation and Treatment: Rehabilitation  ONSET DATE: November   SUBJECTIVE:                                                                                                                                                                                                         SUBJECTIVE STATEMENT: Patient states that she has been doing her exercises.   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   PAIN:  Are you having pain? Yes NPRS scale: 7-8/10 Pain location: frontal and also a weight in the back of my head, some neck pain (especially when I go to bed) Pain orientation: Bilateral  PAIN TYPE: aching Pain description:  constant  Aggravating factors: when I first go to bed, pain wakes me up early, as the day goes on; stopped running track; concerned about using laptop screen Relieving factors: Icy Hot, cold compress, hot compress   PRECAUTIONS: None   WEIGHT BEARING RESTRICTIONS: No  FALLS:  Has patient fallen in last 6 months? No   OCCUPATION: student  PLOF: Independent  PATIENT GOALS: head pain to stop; stopped running track at school  NEXT MD VISIT: as needed with Dr. Marne  OBJECTIVE:  Note: Objective measures were completed at Evaluation unless otherwise noted.  DIAGNOSTIC FINDINGS:  CT scan at the hospital normal in Nov  PATIENT SURVEYS:  NDI:  NECK DISABILITY INDEX  Date: 1/13 Score  Pain intensity 1 = The pain is very mild at the moment  2. Personal care (washing, dressing, etc.) 2 = It is painful to look after myself and I am slow and careful  3. Lifting 3 = Pain prevents me from lifting heavy weights but I can manage light to medium   weights if they are conveniently positioned  4. Reading 2 =  I can read as much as I want with moderate pain in my neck  5. Headaches 5 = I have headaches almost all the time  6. Concentration 2 = I have a fair degree of difficulty in concentrating when I want to  7. Work 2 = I can do most of my usual work, but no more  8. Driving 2 =  I can drive my car as long as I want with moderate pain in my neck  9. Sleeping 3 =  My sleep is moderately disturbed (2-3  hrs sleepless)  10. Recreation 5 = I can't do any recreation activities at all  Total 56%   Minimum Detectable Change (90% confidence): 5 points or 10% points  COGNITION: Overall cognitive status: Within functional limits for tasks assessed   POSTURE: rounded shoulders and forward head  PALPATION: Taut bands bil upper traps, cervical paraspinals, suboccipitals   CERVICAL ROM:   Active ROM A/ROM (deg) eval  Flexion 60 pain  Extension 55  Right lateral flexion 35  Left lateral flexion 40  Right rotation 38  Left rotation 45   (Blank rows = not tested)  Deep cervical flexors and extensor strength grossly 4/5  UPPER EXTREMITY ROM: WFLS no pain UPPER EXTREMITY MMT: WFLS CERVICAL SPECIAL TESTS:  + response to cervical distraction seated and supine  TREATMENT DATE:   07/24/2024: UBE level 1.0 x2 min each direction with PT present to discuss status Seated upper trap stretch 2x20 sec hold bilat Seated levator stretch 2x20 sec bilat Seated cervical retraction 2x10 (patient reported some pain with this) Seated cervical SNAGs for rotation x10 bilat Supine cervical retraction 2x10 (reports felt better than in sitting) Supine cervical MELT method with foam roll: x10 for cervical rotation, x10 for flexion/extension Trigger Point Dry Needling Initial Treatment: Pt instructed on Dry Needling rational, procedures, and possible side effects. Pt instructed to expect mild to moderate muscle soreness later in the day and/or into the next day.  Pt instructed in methods to reduce muscle soreness. Pt instructed to continue prescribed HEP. Because Dry Needling was performed over or adjacent to a lung field, pt was educated on S/S of pneumothorax and to seek immediate medical attention should they occur.  Patient was educated on signs and symptoms of infection and other risk factors and advised to seek medical attention should they occur.  Patient verbalized understanding of  these instructions  and education.  Patient Verbal Consent Given: Yes Education Handout Provided: Yes Muscles Treated: bilateral upper trap, bilateral cervical multifidi, bilateral suboccipitals Electrical Stimulation Performed: No Treatment Response/Outcome: Utilized skilled palpation to identify bony landmarks and trigger points.  Able to illicit twitch response and muscle elongation.  Soft tissue mobilization following to further promote tissue elongation.      07/18/24 evaluation   Anti inflammatory strategies Initial HEP as below                                                                                                                               PATIENT EDUCATION:  Education details: Educated patient on anatomy and physiology of current symptoms, prognosis, plan of care as well as initial self care strategies to promote recovery Person educated: Patient Education method: Explanation Education comprehension: verbalized understanding  HOME EXERCISE PROGRAM: Access Code: [email or web address] URL: [email or web address] Date: 07/24/2024 Prepared by: Jarrell Jacoya Bauman  Exercises - Seated Cervical Retraction  - 2-3 x daily - 7 x weekly - 1 sets - 10 reps - Standing Cervical Flexion Stretch  - 1 x daily - 7 x weekly - 1 sets - 3 reps - 20 hold - Seated Assisted Cervical Rotation with Towel  - 1-2 x daily - 7 x weekly - 1 sets - 10 reps - Seated Scapular Retraction  - 1 x daily - 7 x weekly - 2 sets - 10 reps - Seated Upper Trapezius Stretch  - 1 x daily - 7 x weekly - 2 reps - 20 sec hold - Seated Levator Scapulae Stretch  - 1 x daily - 7 x weekly - 2 reps - 20 sec hold - Supine Chin Tuck  - 1 x daily - 7 x weekly - 2 sets - 10 reps - External Masseter Mobilization  - 1 x daily - 7 x weekly - 1 sets - 10 reps  ASSESSMENT:  CLINICAL IMPRESSION:   Pamila presents to skilled PT reporting that she is doing her exercises and they seem to be helping some, but she is still having 7-8/10 pain and a  headache.  Patient was educated about dry needling and she was agreeable to performing this today.  Patient with pain with cervical retraction in sitting, so tried again in supine and patient reported less pain.  Patient reported that stretching was helpful.  Added scapular retraction secondary to patient's seated posture and patient reported this exercise felt better.  Patient was provided with updated HEP and sent the app via text message to allow her to utilize at home.  Patient was agreeable to dry needling and had a great twitch response noted, especially with upper traps.  With manual therapy and soft tissue mobilization after, noted increased spasms with right side sternocleidomastoid, so educated patient that next week, we may want to dry needle that area.  Patient educated about the importance of improved posture throughout the day, especially with weight lifting; she  verbalized understanding.  OBJECTIVE IMPAIRMENTS: decreased activity tolerance, decreased ROM, decreased strength, increased fascial restrictions, impaired perceived functional ability, postural dysfunction, and pain.   ACTIVITY LIMITATIONS: lifting, sleeping, and hygiene/grooming  PARTICIPATION LIMITATIONS: driving, community activity, and school  PERSONAL FACTORS: good health positively affecting patient's functional outcome.   REHAB POTENTIAL: Good  CLINICAL DECISION MAKING: Stable/uncomplicated  EVALUATION COMPLEXITY: Low   GOALS: Goals reviewed with patient? Yes  SHORT TERM GOALS: Target date: 08/29/2024     The patient will demonstrate knowledge of basic self care strategies and exercises to promote healing  Baseline:  Goal status: INITIAL  2.  The patient will report a  40% improvement in pain levels with functional activities which are currently difficult including lifting, sleeping, driving and reading/looking at her laptop Baseline:  Goal status: INITIAL  3.  Decreased headache frequency and intensity by  40% Baseline:  Goal status: INITIAL  4.  Improved cervical rotation to 50 degrees needed for driving Baseline:  Goal status: INITIAL      LONG TERM GOALS: Target date: 10/10/2024      The patient will be independent in a safe self progression of a home exercise program to promote further recovery of function  Baseline:  Goal status: INITIAL  2.  The patient will report a  75% improvement in pain levels with functional activities which are currently difficult including lifting, sleeping, driving and reading/looking at her laptop Baseline:  Goal status: INITIAL  3.  Decreased headache frequency and intensity by 60% Baseline:  Goal status: INITIAL  4.   Improved cervical rotation and sidebending to 60 degrees needed for driving Baseline:  Goal status: INITIAL  5.  Neck Disability Index improved to  42  % indicating improved function with less pain Baseline:  Goal status: INITIAL    PLAN:  PT FREQUENCY: 1x/week  PT DURATION: 12 weeks  PLANNED INTERVENTIONS: 97164- PT Re-evaluation, 97110-Therapeutic exercises, 97530- Therapeutic activity, 97112- Neuromuscular re-education, 97535- Self Care, 02859- Manual therapy, G0283- Electrical stimulation (unattended), [phone]- Electrical stimulation (manual), [id number]- Ultrasound, [id number]- Traction (mechanical), [id number]- Ionotophoresis 4mg /ml Dexamethasone , 79439 (1-2 muscles), 20561 (3+ muscles)- Dry Needling, Patient/Family education, Joint mobilization, Spinal manipulation, Spinal mobilization, Cryotherapy, and Moist heat  PLAN FOR NEXT SESSION: Assess and progress HEP as indicated, strengthening, flexibility, manual/dry needling as indicated (add in sternocleidomastoid)     Jarrell Laming, PT, DPT 07/24/24, 8:58 AM  [facility] [address] [place], [place] [address] Phone # [phone] Fax [phone]  "

## 2024-07-24 NOTE — Patient Instructions (Signed)

## 2024-07-27 ENCOUNTER — Ambulatory Visit: Admitting: Physical Therapy

## 2024-07-27 ENCOUNTER — Ambulatory Visit: Admitting: Neurology

## 2024-08-01 ENCOUNTER — Telehealth: Payer: Self-pay | Admitting: Neurology

## 2024-08-01 ENCOUNTER — Ambulatory Visit: Admitting: Physical Medicine and Rehabilitation

## 2024-08-01 ENCOUNTER — Other Ambulatory Visit (INDEPENDENT_AMBULATORY_CARE_PROVIDER_SITE_OTHER): Payer: Self-pay

## 2024-08-01 ENCOUNTER — Encounter: Payer: Self-pay | Admitting: Physical Medicine and Rehabilitation

## 2024-08-01 DIAGNOSIS — M7918 Myalgia, other site: Secondary | ICD-10-CM

## 2024-08-01 DIAGNOSIS — M542 Cervicalgia: Secondary | ICD-10-CM | POA: Diagnosis not present

## 2024-08-01 DIAGNOSIS — G4486 Cervicogenic headache: Secondary | ICD-10-CM | POA: Diagnosis not present

## 2024-08-01 NOTE — Progress Notes (Signed)
 Pain Scale   Average Pain 8 Patient advising she has neck pain that radiates to her head and is currently in PT has had 1 Dry Needling.        +Driver, -BT, -Dye Allergies.

## 2024-08-01 NOTE — Telephone Encounter (Signed)
 If she would like to see another provider, I recommend she talk to her PCP about a referral. I had also recommended she talk to PCP about seeing a spine specialist for her neck pain. See by ortho today. Recommend awaiting results from PT and dry needling at this point.

## 2024-08-01 NOTE — Telephone Encounter (Signed)
 Patient said, orthopedic physician advised to schedule appointment with neurologist due to headaches. Discontinuous headache since November 2025, have taken Emgality, no over counter medication. Pain level for headache is a 8 or 9. Would like a call back.

## 2024-08-01 NOTE — Telephone Encounter (Signed)
 I called pt.  She said that she has taken the emgality monthly as ordered, has seen ophthalmologist recently (ok).  Has taken imitrex for migraines prn, but she said she is still having daily headaches.  (Same as when seen).  After I said would send message, she asked about seeing another provider (I told her the process needs ok from out and in coming provider for changing provider.). Are there any other recommendations you would suggest?

## 2024-08-01 NOTE — Progress Notes (Signed)
 "  Holly Goodman - 24 y.o. female MRN [id number]  Date of birth: [birth year]  Office Visit Note: Visit Date: 08/01/2024 PCP: Jaycee Greig PARAS, NP Referred by: Jaycee Greig PARAS, NP  Subjective: Chief Complaint  Patient presents with   Neck - Pain   HPI: Holly Goodman is a 24 y.o. female who comes in today per the request of Greig Jaycee, NP for evaluation of chronic, worsening and severe bilateral neck pain radiating up to head. Her symptoms started in December of 2025 and is typically accompanied by headache. States she woke up with pain one morning. Her neck pain worsens with laying down and movement. She describes pain as sore and aching sensation, currently rates as 7 out of 10. Some relief of pain with home exercise regimen, rest and use of medications. She recently started formal physical therapy, good relief of pain with dry needling. Also reports good relief of pain with massage therapy. No prior imaging of cervical spine. No history of cervical surgery/injections. She has stopped running track and lifting weights due to discomfort.   She recently consulted with Dr. True Mar with [facility] for continued headaches. Dr. Mar advised her to continue with [facility] and speak with PCP regarding treatment of depression. She also had conversation via MyChart with Dr. Mar regarding possibility of occipital nerve blocks and was referred to our office for further work up of neck pain. She recently started [facility], does not feel this medication is beneficial in alleviating her pain. She continues to experience nausea and photosensitivity.   Patients course is complicated by anxiety and depression. She is consulting civil engineer at [employer], she is land, runs track.      Review of Systems  Eyes:        Reports photosensitivity  Gastrointestinal:  Positive for nausea.  Musculoskeletal:  Positive for myalgias and neck pain.  Neurological:  Positive for  headaches. Negative for sensory change and weakness.  All other systems reviewed and are negative.  Otherwise per HPI.  Assessment & Plan: Visit Diagnoses:    ICD-10-CM   1. Cervicalgia  M54.2 XR Cervical Spine 2 or 3 views    2. Cervicogenic headache  G44.86     3. Myofascial pain syndrome  M79.18        Plan: Findings:  Chronic, worsening and severe bilateral neck pain radiating up to head. Also associated headaches. Patient continues to have symptoms despite good conservative therapies such as physical therapy/dry needling, massage therapy and medications. I obtained cervical radiographs in the office today that show normal alignment and segmentation, well preserved disc spacing, no spondylolisthesis. Overall, cervical radiographs look fairly normal for her age. Patients clinical presentation and exam are complex, differentials include myofascial pain syndrome, cervicogenic headache and isolated headache disorder. Significant myofascial pain upon palpation of bilateral levator scapulae and trapezius regions on exam today. We discussed treatment plan in detail today. I would like for patient to continue with PT, dry needling and massage therapy. I would also like her continue close follow up with neurology for management of headaches. I explained to her that myofascial pain can take time to heal, especially if more of a strain injury. I encouraged her to continue with PT for at least 6-8 weeks. Should her pain persist would recommend obtaining cervical MRI imaging. She can follow up with us  as needed.   She did inquire about occipital nerve blocks today, however this would be something we would refer back to  neurology. We could look at C2-C3 facet joint injections (third occipital nerve blocks) if neurology feels this is more of occipital neuralgia. Could also look at MRI of the brain to rule out any structural abnormalities to explain her symptoms.     Meds & Orders: No orders of the defined  types were placed in this encounter.   Orders Placed This Encounter  Procedures   XR Cervical Spine 2 or 3 views    Follow-up: Return if symptoms worsen or fail to improve.   Procedures: No procedures performed      Clinical History: No specialty comments available.   She reports that she has never smoked. She has been exposed to tobacco smoke. She has never used smokeless tobacco. No results for input(s): HGBA1C, LABURIC in the last 8760 hours.  Objective:  VS:  HT:    WT:   BMI:     BP:   HR: bpm  TEMP: ( )  RESP:  Physical Exam Vitals and nursing note reviewed.  HENT:     Head: Normocephalic and atraumatic.     Right Ear: External ear normal.     Left Ear: External ear normal.     Nose: Nose normal.     Mouth/Throat:     Mouth: Mucous membranes are moist.  Eyes:     Extraocular Movements: Extraocular movements intact.  Cardiovascular:     Rate and Rhythm: Normal rate.     Pulses: Normal pulses.  Pulmonary:     Effort: Pulmonary effort is normal.  Abdominal:     General: Abdomen is flat. There is no distension.  Musculoskeletal:        General: Tenderness present.     Cervical back: Tenderness present.     Comments: Discomfort noted with flexion, extension and side-to-side rotation. Patient has good strength in the upper extremities including 5 out of 5 strength in wrist extension, long finger flexion and APB. Shoulder range of motion is full bilaterally without any sign of impingement. There is no atrophy of the hands intrinsically. Sensation intact bilaterally. Myofascial tenderness noted upon palpation of bilateral levator scapulae and trapezius regions. Negative Hoffman's sign. Negative Spurling's sign.     Skin:    General: Skin is warm and dry.     Capillary Refill: Capillary refill takes less than 2 seconds.  Neurological:     General: No focal deficit present.     Mental Status: She is alert and oriented to person, place, and time.  Psychiatric:         Mood and Affect: Mood normal.        Behavior: Behavior normal.     Ortho Exam  Imaging: XR Cervical Spine 2 or 3 views Result Date: 08/01/2024 AP and lateral radiographs show normal cervical lordosis, normal alignment and segmentation, well preserved disc spacing, no spondylolisthesis. No fractures.    Past Medical/Family/Surgical/Social History: Medications & Allergies reviewed per EMR, new medications updated. Patient Active Problem List   Diagnosis Date Noted   Generalized anxiety disorder with panic attacks 02/17/2023   Social anxiety disorder 02/17/2023   Psychophysiological insomnia 02/17/2023   Vitamin D  deficiency 02/17/2023   Tonsillar hypertrophy 01/01/2023   Proteinuria 12/02/2022   Leukocytes in urine 12/02/2022   Encounter for well child check without abnormal findings 04/12/2017   BMI (body mass index), pediatric, 5% to less than 85% for age 31/02/2017   Past Medical History:  Diagnosis Date   Eczema    Lacrimal duct stenosis [id number]  Mild persistent asthma with acute exacerbation 07/07/2012   Exacerbation off and on from 06/21/12 through 07/07/12   Sickle cell trait    Family History  Problem Relation Age of Onset   Allergic rhinitis Mother    Sickle cell trait Mother    Miscarriages / Stillbirths Mother    Migraines Father    Hypertension Maternal Grandmother    Hyperlipidemia Maternal Grandmother    Diabetes Maternal Grandfather    Hypertension Maternal Grandfather    Cancer Maternal Grandfather        prostate   Hearing loss Maternal Freight forwarder exposure   Thyroid  disease Maternal Aunt    Alcohol abuse Neg Hx    Arthritis Neg Hx    Asthma Neg Hx    Birth defects Neg Hx    COPD Neg Hx    Depression Neg Hx    Drug abuse Neg Hx    Early death Neg Hx    Heart disease Neg Hx    Kidney disease Neg Hx    Learning disabilities Neg Hx    Mental illness Neg Hx    Mental retardation Neg Hx    Stroke Neg Hx    Vision loss Neg  Hx    Varicose Veins Neg Hx    Seizures Neg Hx    Sleep apnea Neg Hx    History reviewed. No pertinent surgical history. Social History   Occupational History   Not on file  Tobacco Use   Smoking status: Never    Passive exposure: Current   Smokeless tobacco: Never  Vaping Use   Vaping status: Never Used  Substance and Sexual Activity   Alcohol use: No   Drug use: No   Sexual activity: Yes    Birth control/protection: Pill    Comment: Last encounter: 2nd week of July 2024   "

## 2024-08-02 ENCOUNTER — Telehealth: Payer: Self-pay | Admitting: *Deleted

## 2024-08-02 ENCOUNTER — Other Ambulatory Visit: Payer: Self-pay | Admitting: Family

## 2024-08-02 ENCOUNTER — Ambulatory Visit: Admitting: Physical Therapy

## 2024-08-02 ENCOUNTER — Encounter: Payer: Self-pay | Admitting: Physical Therapy

## 2024-08-02 DIAGNOSIS — G4489 Other headache syndrome: Secondary | ICD-10-CM | POA: Diagnosis not present

## 2024-08-02 DIAGNOSIS — M549 Dorsalgia, unspecified: Secondary | ICD-10-CM

## 2024-08-02 DIAGNOSIS — G4486 Cervicogenic headache: Secondary | ICD-10-CM

## 2024-08-02 DIAGNOSIS — M62838 Other muscle spasm: Secondary | ICD-10-CM

## 2024-08-02 DIAGNOSIS — M542 Cervicalgia: Secondary | ICD-10-CM

## 2024-08-02 NOTE — Telephone Encounter (Signed)
 Spoke to pt--stated been seen to [facility] w/ Dr Adrian need spine specialist. Please advise

## 2024-08-02 NOTE — Telephone Encounter (Signed)
 Complete

## 2024-08-02 NOTE — Therapy (Signed)
 " OUTPATIENT PHYSICAL THERAPY CERVICAL PROGRESS NOTE   Patient Name: Holly Goodman MRN: [id number] DOB:Sep 20, 2000, 24 y.o., female Today's Date: 08/02/2024  END OF SESSION:  PT End of Session - 08/02/24 1010     Visit Number 3    Date for Recertification  10/10/24    Authorization Type [facility] 07/18/24-09/15/2024-, auth# [id number]    Authorization - Visit Number 2    Authorization - Number of Visits 6    PT Start Time 1013    PT Stop Time 1055    PT Time Calculation (min) 42 min    Activity Tolerance Patient tolerated treatment well          Past Medical History:  Diagnosis Date   Eczema    Lacrimal duct stenosis [id number]   Mild persistent asthma with acute exacerbation 07/07/2012   Exacerbation off and on from 06/21/12 through 07/07/12   Sickle cell trait    History reviewed. No pertinent surgical history. Patient Active Problem List   Diagnosis Date Noted   Generalized anxiety disorder with panic attacks 02/17/2023   Social anxiety disorder 02/17/2023   Psychophysiological insomnia 02/17/2023   Vitamin D  deficiency 02/17/2023   Tonsillar hypertrophy 01/01/2023   Proteinuria 12/02/2022   Leukocytes in urine 12/02/2022   Encounter for well child check without abnormal findings 04/12/2017   BMI (body mass index), pediatric, 5% to less than 85% for age 58/02/2017    PCP: Jaycee No NP  REFERRING PROVIDER: Marne Marseille MD  REFERRING DIAG: G44.89 headache syndrome  THERAPY DIAG:  Headache; neck pain; weakness  Rationale for Evaluation and Treatment: Rehabilitation  ONSET DATE: November   SUBJECTIVE:                                                                                                                                                                                                         SUBJECTIVE STATEMENT: Classes going OK. H/A are same but no migraines.  Issues with sleeping and hurting at night time.   DN went well and I felt some neck pain and  headache relief afterwards 1-2 days.  A little sore but not too pain.  Saw ortho yesterday and they did x-rays and it looks fine and to continue with recommendations.     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   PAIN:  1/28 Are you having pain? Yes NPRS scale: 7-8/10 Pain location: neck and head pain (posterior and frontal near temples) Pain orientation: Bilateral  PAIN TYPE: aching Pain description: constant  Aggravating factors: when I first go to bed, pain wakes me up early, as the day goes on; stopped running track; concerned about using laptop screen Relieving factors: Icy Hot, cold compress, hot compress   PRECAUTIONS: None   WEIGHT BEARING RESTRICTIONS: No  FALLS:  Has patient fallen in last 6 months? No   OCCUPATION: student  PLOF: Independent  PATIENT GOALS: head pain to stop; stopped running track at school  NEXT MD VISIT: as needed with Dr. Marne  OBJECTIVE:  Note: Objective measures were completed at Evaluation unless otherwise noted.  DIAGNOSTIC FINDINGS:  CT scan at the hospital normal in Nov  PATIENT SURVEYS:  NDI:  NECK DISABILITY INDEX  Date: 1/13 Score  Pain intensity 1 = The pain is very mild at the moment  2. Personal care (washing, dressing, etc.) 2 = It is painful to look after myself and I am slow and careful  3. Lifting 3 = Pain prevents me from lifting heavy weights but I can manage light to medium   weights if they are conveniently positioned  4. Reading 2 =  I can read as much as I want with moderate pain in my neck  5. Headaches 5 = I have headaches almost all the time  6. Concentration 2 = I have a fair degree of difficulty in concentrating when I want to  7. Work 2 = I can do most of my usual  work, but no more  8. Driving 2 =  I can drive my car as long as I want with moderate pain in my neck  9. Sleeping 3 =  My sleep is moderately disturbed (2-3 hrs sleepless)  10. Recreation 5 = I can't do any recreation activities at all  Total 56%   Minimum Detectable Change (90% confidence): 5 points or 10% points  COGNITION: Overall cognitive status: Within functional limits for tasks assessed   POSTURE: rounded shoulders and forward head  PALPATION: Taut bands bil upper traps, cervical paraspinals, suboccipitals   CERVICAL ROM:   Active ROM A/ROM (deg) eval  Flexion 60 pain  Extension 55  Right lateral flexion 35  Left lateral flexion 40  Right rotation 38  Left rotation 45   (Blank rows = not tested)  Deep cervical flexors and extensor strength grossly 4/5  UPPER EXTREMITY ROM: WFLS no pain UPPER EXTREMITY MMT: WFLS CERVICAL SPECIAL TESTS:  + response to cervical distraction seated and supine  TREATMENT DATE:  08/02/2024: Sleep positions supine and sidelying with towel roll in pillow case Trigger Point Dry Needling Subsequent Treatment: Pt instructed on Dry Needling rational, procedures, and possible side effects. Pt instructed to expect mild to moderate muscle soreness later in the day and/or into the next day.  Pt instructed in methods to reduce muscle soreness. Pt instructed to continue prescribed HEP. Because Dry Needling was performed over or adjacent to a lung field, pt was educated on S/S of pneumothorax and to seek immediate medical attention should they occur.  Patient was educated on signs and symptoms of infection and other risk factors and advised to seek medical attention should they occur.  Patient verbalized understanding of these instructions and education.  Patient Verbal Consent Given: Yes Education Handout Provided: Yes Muscles Treated: bilateral upper trap, bil levator scap; bilateral cervical  multifidi, bilateral suboccipitals, right > left  splenius capitus Electrical Stimulation Performed: No Treatment Response/Outcome: Utilized skilled palpation to identify bony landmarks and trigger points.  Able to illicit twitch response and muscle elongation.  Soft tissue mobilization following to further promote tissue elongation.   Review of levator scap stretching Suboccipital release with 2 tennis balls in sock (used clinic double ball) Neuromuscular pumping circuit format 2 rounds: 1# front raises 10x 1# lateral raises 10x Shrugs 5# 10x   07/24/2024: UBE level 1.0 x2 min each direction with PT present to discuss status Seated upper trap stretch 2x20 sec hold bilat Seated levator stretch 2x20 sec bilat Seated cervical retraction 2x10 (patient reported some pain with this) Seated cervical SNAGs for rotation x10 bilat Supine cervical retraction 2x10 (reports felt better than in sitting) Supine cervical MELT method with foam roll: x10 for cervical rotation, x10 for flexion/extension Trigger Point Dry Needling Initial Treatment: Pt instructed on Dry Needling rational, procedures, and possible side effects. Pt instructed to expect mild to moderate muscle soreness later in the day and/or into the next day.  Pt instructed in methods to reduce muscle soreness. Pt instructed to continue prescribed HEP. Because Dry Needling was performed over or adjacent to a lung field, pt was educated on S/S of pneumothorax and to seek immediate medical attention should they occur.  Patient was educated on signs and symptoms of infection and other risk factors and advised to seek medical attention should they occur.  Patient verbalized understanding of these instructions and education.  Patient Verbal Consent Given: Yes Education Handout Provided: Yes Muscles Treated: bilateral upper trap, bilateral cervical multifidi, bilateral suboccipitals Electrical Stimulation Performed: No Treatment Response/Outcome: Utilized skilled palpation to identify bony  landmarks and trigger points.  Able to illicit twitch response and muscle elongation.  Soft tissue mobilization following to further promote tissue elongation.      07/18/24 evaluation   Anti inflammatory strategies Initial HEP as below                                                                                                                               PATIENT EDUCATION:  Education details: Educated patient on anatomy and physiology of current symptoms, prognosis, plan of care as well as initial self care strategies to promote recovery Person educated: Patient Education method: Explanation Education comprehension: verbalized understanding  HOME EXERCISE PROGRAM: Access Code: [email or web address] URL: [email or web address] Date: 08/02/2024 Prepared by: Glade Pesa  Exercises - Seated Cervical Retraction  - 2-3 x daily - 7 x weekly - 1 sets - 10 reps - Standing Cervical Flexion Stretch  - 1 x daily - 7 x weekly - 1 sets - 3 reps - 20 hold - Seated Assisted Cervical Rotation with Towel  - 1-2 x daily - 7 x weekly - 1 sets - 10 reps - Seated Scapular Retraction  - 1 x daily - 7 x weekly - 2 sets - 10 reps - Seated Upper  Trapezius Stretch  - 1 x daily - 7 x weekly - 2 reps - 20 sec hold - Seated Levator Scapulae Stretch  - 1 x daily - 7 x weekly - 2 reps - 20 sec hold - Supine Chin Tuck  - 1 x daily - 7 x weekly - 2 sets - 10 reps - External Masseter Mobilization  - 1 x daily - 7 x weekly - 1 sets - 10 reps - Supine Suboccipital Release with Tennis Balls  - 1 x daily - 7 x weekly - 1 sets - 1 reps  ASSESSMENT:  CLINICAL IMPRESSION:  [place] reports 1-2 days relief of neck and headache pain after last visit but the pain returned to previous moderate levels.  The patient responded well from dry needling today to stimulate underlying myofascial trigger points and muscular tissue for management of neuromusculoskeletal pain and address movement impairments.  Much improved soft  tissue mobility and decreased tender point size and number following treatment session.  The patient was encouraged in regular performance of HEP post DN including soft tissue lengthening and strengthening exercises to enhance long term benefit.  Therapist educating patient on sleep positions with cervical support and self suboccipital release.    OBJECTIVE IMPAIRMENTS: decreased activity tolerance, decreased ROM, decreased strength, increased fascial restrictions, impaired perceived functional ability, postural dysfunction, and pain.   ACTIVITY LIMITATIONS: lifting, sleeping, and hygiene/grooming  PARTICIPATION LIMITATIONS: driving, community activity, and school  PERSONAL FACTORS: good health positively affecting patient's functional outcome.   REHAB POTENTIAL: Good  CLINICAL DECISION MAKING: Stable/uncomplicated  EVALUATION COMPLEXITY: Low   GOALS: Goals reviewed with patient? Yes  SHORT TERM GOALS: Target date: 08/29/2024     The patient will demonstrate knowledge of basic self care strategies and exercises to promote healing  Baseline:  Goal status: INITIAL  2.  The patient will report a  40% improvement in pain levels with functional activities which are currently difficult including lifting, sleeping, driving and reading/looking at her laptop Baseline:  Goal status: INITIAL  3.  Decreased headache frequency and intensity by 40% Baseline:  Goal status: INITIAL  4.  Improved cervical rotation to 50 degrees needed for driving Baseline:  Goal status: INITIAL      LONG TERM GOALS: Target date: 10/10/2024      The patient will be independent in a safe self progression of a home exercise program to promote further recovery of function  Baseline:  Goal status: INITIAL  2.  The patient will report a  75% improvement in pain levels with functional activities which are currently difficult including lifting, sleeping, driving and reading/looking at her laptop Baseline:   Goal status: INITIAL  3.  Decreased headache frequency and intensity by 60% Baseline:  Goal status: INITIAL  4.   Improved cervical rotation and sidebending to 60 degrees needed for driving Baseline:  Goal status: INITIAL  5.  Neck Disability Index improved to  42  % indicating improved function with less pain Baseline:  Goal status: INITIAL    PLAN:  PT FREQUENCY: 1x/week  PT DURATION: 12 weeks  PLANNED INTERVENTIONS: 97164- PT Re-evaluation, 97110-Therapeutic exercises, 97530- Therapeutic activity, 97112- Neuromuscular re-education, 97535- Self Care, 02859- Manual therapy, G0283- Electrical stimulation (unattended), [id number]- Electrical stimulation (manual), [id number]- Ultrasound, [id number]- Traction (mechanical), [id number]- Ionotophoresis 4mg /ml Dexamethasone , 79439 (1-2 muscles), 20561 (3+ muscles)- Dry Needling, Patient/Family education, Joint mobilization, Spinal manipulation, Spinal mobilization, Cryotherapy, and Moist heat  PLAN FOR NEXT SESSION: Assess and progress HEP as indicated, strengthening, flexibility, assess response  to dry needling as indicated; may also add ES to DN; avoid lotions caused a rash    Glade Pesa, PT 08/02/24 3:49 PM Phone: [phone] Fax: [phone]  [facility] [address], [address] [place], [place] [address] Phone # [phone] Fax [phone]  "

## 2024-08-02 NOTE — Telephone Encounter (Signed)
 Pt been notified regarding referral orthopedic.

## 2024-08-02 NOTE — Telephone Encounter (Signed)
 LMVM for pt to return call.

## 2024-08-02 NOTE — Telephone Encounter (Signed)
 Pt returned call.  I spoke to her relayed that 2 things:  1) continue with recommendations for PT and dry needling recommended per [facility].  2) speak with pcp about another referral for other neurologist here in our office or other clinic.   She verbalized understanding.

## 2024-08-02 NOTE — Telephone Encounter (Signed)
 Pt has returned call to RN

## 2024-08-03 ENCOUNTER — Telehealth: Payer: Self-pay | Admitting: Orthopedic Surgery

## 2024-08-03 ENCOUNTER — Ambulatory Visit

## 2024-08-03 ENCOUNTER — Telehealth: Payer: Self-pay

## 2024-08-03 NOTE — Telephone Encounter (Signed)
 I advised the patient that our providers would review her referral and that she would receive a return call from our office. Although the referral listed unspecified back pain, I explained that we will confirm if we are able to evaluate her cervical weakness based on her letting me know that her headaches are contributing to her neck pain/weakness; however, her headaches fall outside the scope of what our office can assess.

## 2024-08-03 NOTE — Telephone Encounter (Signed)
 Error

## 2024-08-03 NOTE — Telephone Encounter (Signed)
 Pt called and wants to know if nerve block was an option for her neck pain.  CB: [phone]

## 2024-08-03 NOTE — Telephone Encounter (Signed)
 Pt called saying she feels she is referred to the wrong office for neurosurgery.  She was referred to [facility].  She said it is not for back pain.  It is for neck pain and headaches.  Please call patient [phone]

## 2024-08-03 NOTE — Telephone Encounter (Signed)
 Ok to see a PA for neck pain  per Kendelyn / Pt sch

## 2024-08-04 ENCOUNTER — Telehealth: Payer: Self-pay | Admitting: Family

## 2024-08-04 ENCOUNTER — Telehealth: Payer: Self-pay

## 2024-08-04 NOTE — Telephone Encounter (Signed)
 Copied from CRM [id number]. Topic: Referral - Question >> Aug 04, 2024  9:15 AM Rea ORN wrote: Reason for CRM: Pt called to advise she needs a referral for neck pain and headaches. She would like to be referred to a spine specialist. She was told by Dr. Buck to reach out to PCP for a spine specialist. There was a referral that was placed on 1/28 but it was for back pain. When she spoke to Neurosurgery at [facility], they advised her that per 1/29 encounter: I advised the patient that our providers would review her referral and that she would receive a return call from our office. Although the referral listed unspecified back pain, I explained that we will confirm if we are able to evaluate her cervical weakness based on her letting me know that her headaches are contributing to her neck pain/weakness; however, her headaches fall outside the scope of what our office can assess.  Please call back to advise,  [phone]

## 2024-08-04 NOTE — Telephone Encounter (Signed)
 Patient called in regarding referral, she was originally referred to the [facility] neurology center on [address] but they rerouted  the referral to there [place] location and no information for the [place] location was listed. I called the [facility] location got the information and patient was called again and given info.    [address] [place]- [phone])

## 2024-08-08 ENCOUNTER — Telehealth: Payer: Self-pay | Admitting: Family

## 2024-08-08 ENCOUNTER — Other Ambulatory Visit: Payer: Self-pay | Admitting: Family

## 2024-08-08 DIAGNOSIS — M542 Cervicalgia: Secondary | ICD-10-CM

## 2024-08-08 NOTE — Telephone Encounter (Signed)
 Copied from CRM [id number]. Topic: Referral - Status >> Aug 08, 2024 11:17 AM Rosaria BRAVO wrote: Reason for CRM: Pt called reporting that she did not like the office she was sent to for her neurology referral, she is seeking an alternative location.   Best contact: [phone]

## 2024-08-08 NOTE — Telephone Encounter (Signed)
 Neurosurgery referral complete for neck pain. Recommendation for patient to let established Neurology know advisement from Neurosurgery referral.

## 2024-08-08 NOTE — Telephone Encounter (Signed)
 routed

## 2024-08-09 ENCOUNTER — Other Ambulatory Visit: Payer: Self-pay | Admitting: Family

## 2024-08-09 ENCOUNTER — Encounter: Payer: Self-pay | Admitting: Neurology

## 2024-08-09 ENCOUNTER — Telehealth: Payer: Self-pay | Admitting: Physical Medicine and Rehabilitation

## 2024-08-09 DIAGNOSIS — R519 Headache, unspecified: Secondary | ICD-10-CM

## 2024-08-09 NOTE — Telephone Encounter (Signed)
 Spoke to pt--requesting another referral to neurology-due to did not like the provider. Please advise.

## 2024-08-09 NOTE — Telephone Encounter (Signed)
 Pt been notified regarding Neurosurgery referral.

## 2024-08-09 NOTE — Telephone Encounter (Signed)
 Pt called wanting to talk about getting an MRI done. Call back number is [phone].

## 2024-08-09 NOTE — Telephone Encounter (Signed)
 Complete

## 2024-08-10 ENCOUNTER — Ambulatory Visit: Admitting: Family

## 2024-08-10 ENCOUNTER — Ambulatory Visit

## 2024-08-10 ENCOUNTER — Telehealth: Payer: Self-pay

## 2024-08-10 ENCOUNTER — Encounter: Payer: Self-pay | Admitting: Family

## 2024-08-10 VITALS — BP 116/80 | HR 116 | Ht 63.0 in | Wt 121.4 lb

## 2024-08-10 DIAGNOSIS — R0989 Other specified symptoms and signs involving the circulatory and respiratory systems: Secondary | ICD-10-CM

## 2024-08-10 MED ORDER — PSEUDOEPH-BROMPHEN-DM 30-2-10 MG/5ML PO SYRP: 5.0000 mL | ORAL_SOLUTION | Freq: Four times a day (QID) | ORAL | 0 refills | Status: DC | PRN

## 2024-08-10 NOTE — Progress Notes (Signed)
 "   Patient ID: Holly Goodman, female    DOB: May 03, 2001  MRN: [id number]  CC: Follow-Up  Subjective: Holly Goodman is a 24 y.o. female who presents for follow-up.   Her concerns today include:  States body pain, sore throat, and chills. Denies red flag symptoms. Taking over-the-counter medications with minimal relief.   Patient Active Problem List   Diagnosis Date Noted   Generalized anxiety disorder with panic attacks 02/17/2023   Social anxiety disorder 02/17/2023   Psychophysiological insomnia 02/17/2023   Vitamin D  deficiency 02/17/2023   Tonsillar hypertrophy 01/01/2023   Proteinuria 12/02/2022   Leukocytes in urine 12/02/2022   Encounter for well child check without abnormal findings 04/12/2017   BMI (body mass index), pediatric, 5% to less than 85% for age 22/02/2017     Medications Ordered Prior to Encounter[1]  Allergies[2]  Social History   Socioeconomic History   Marital status: Single    Spouse name: Not on file   Number of children: Not on file   Years of education: Not on file   Highest education level: Not on file  Occupational History   Not on file  Tobacco Use   Smoking status: Never    Passive exposure: Past   Smokeless tobacco: Never  Vaping Use   Vaping status: Never Used  Substance and Sexual Activity   Alcohol use: No   Drug use: No   Sexual activity: Yes    Birth control/protection: Pill  Other Topics Concern   Not on file  Social History Narrative   Sophomore at [employer]   Runs Track   Caffeine - none    Social Drivers of Health   Tobacco Use: Low Risk (08/10/2024)   Patient History    Smoking Tobacco Use: Never    Smokeless Tobacco Use: Never    Passive Exposure: Past  Recent Concern: Tobacco Use - Medium Risk (08/02/2024)   Patient History    Smoking Tobacco Use: Never    Smokeless Tobacco Use: Never    Passive Exposure: Current  Financial Resource Strain: Low Risk (07/11/2024)   Overall Financial Resource  Strain (CARDIA)    Difficulty of Paying Living Expenses: Not hard at all  Food Insecurity: No Food Insecurity (07/11/2024)   Epic    Worried About Programme Researcher, Broadcasting/film/video in the Last Year: Never true    Ran Out of Food in the Last Year: Never true  Transportation Needs: No Transportation Needs (07/11/2024)   Epic    Lack of Transportation (Medical): No    Lack of Transportation (Non-Medical): No  Physical Activity: Sufficiently Active (07/11/2024)   Exercise Vital Sign    Days of Exercise per Week: 7 days    Minutes of Exercise per Session: 30 min  Stress: No Stress Concern Present (07/11/2024)   [employer] of Occupational Health - Occupational Stress Questionnaire    Feeling of Stress: Only a little  Social Connections: Moderately Integrated (07/11/2024)   Social Connection and Isolation Panel    Frequency of Communication with Friends and Family: More than three times a week    Frequency of Social Gatherings with Friends and Family: More than three times a week    Attends Religious Services: More than 4 times per year    Active Member of [employer] or Organizations: Yes    Attends Banker Meetings: More than 4 times per year    Marital Status: Never married  Intimate Partner Violence: Not At Risk (07/11/2024)  Epic    Fear of Current or Ex-Partner: No    Emotionally Abused: No    Physically Abused: No    Sexually Abused: No  Depression (PHQ2-9): Low Risk (07/11/2024)   Depression (PHQ2-9)    PHQ-2 Score: 0  Alcohol Screen: Low Risk (07/11/2024)   Alcohol Screen    Last Alcohol Screening Score (AUDIT): 0  Housing: Unknown (07/11/2024)   Epic    Unable to Pay for Housing in the Last Year: No    Number of Times Moved in the Last Year: Not on file    Homeless in the Last Year: No  Utilities: Not At Risk (07/11/2024)   Epic    Threatened with loss of utilities: No  Health Literacy: Adequate Health Literacy (07/11/2024)   B1300 Health Literacy    Frequency of need for help with  medical instructions: Never    Family History  Problem Relation Age of Onset   Allergic rhinitis Mother    Sickle cell trait Mother    Miscarriages / Stillbirths Mother    Migraines Father    Hypertension Maternal Grandmother    Hyperlipidemia Maternal Grandmother    Diabetes Maternal Grandfather    Hypertension Maternal Grandfather    Cancer Maternal Grandfather        prostate   Hearing loss Maternal Freight forwarder exposure   Thyroid  disease Maternal Aunt    Alcohol abuse Neg Hx    Arthritis Neg Hx    Asthma Neg Hx    Birth defects Neg Hx    COPD Neg Hx    Depression Neg Hx    Drug abuse Neg Hx    Early death Neg Hx    Heart disease Neg Hx    Kidney disease Neg Hx    Learning disabilities Neg Hx    Mental illness Neg Hx    Mental retardation Neg Hx    Stroke Neg Hx    Vision loss Neg Hx    Varicose Veins Neg Hx    Seizures Neg Hx    Sleep apnea Neg Hx     History reviewed. No pertinent surgical history.  ROS: Review of Systems Negative except as stated above  PHYSICAL EXAM: BP 116/80 (BP Location: Left Arm, Patient Position: Sitting, Cuff Size: Normal)   Pulse (!) 116   Ht 5' 3 (1.6 m)   Wt 121 lb 6.4 oz (55.1 kg)   LMP 07/10/2024 (Approximate)   SpO2 98%   BMI 21.51 kg/m   Physical Exam HENT:     Head: Normocephalic and atraumatic.     Right Ear: Tympanic membrane, ear canal and external ear normal.     Left Ear: Tympanic membrane, ear canal and external ear normal.     Nose: Nose normal.     Mouth/Throat:     Mouth: Mucous membranes are moist.     Pharynx: Oropharynx is clear.  Eyes:     Extraocular Movements: Extraocular movements intact.     Conjunctiva/sclera: Conjunctivae normal.     Pupils: Pupils are equal, round, and reactive to light.  Cardiovascular:     Rate and Rhythm: Tachycardia present.     Pulses: Normal pulses.     Heart sounds: Normal heart sounds.  Pulmonary:     Effort: Pulmonary effort is normal.     Breath  sounds: Normal breath sounds.  Musculoskeletal:        General: Normal range of motion.  Cervical back: Normal range of motion and neck supple.  Neurological:     General: No focal deficit present.     Mental Status: She is alert and oriented to person, place, and time.  Psychiatric:        Mood and Affect: Mood normal.        Behavior: Behavior normal.     ASSESSMENT AND PLAN: 1. Upper respiratory symptom (Primary) - Patient today in office with no cardiopulmonary/acute distress.  - Brompheniramine-Pseudoephedrine-DM as prescribed. Counseled on medication adherence/adverse effects.  - Routine screening.  - Follow-up with primary provider as scheduled.  - POCT rapid strep A; Future - Culture, Group A Strep - COVID-19, Flu A+B and RSV - brompheniramine-pseudoephedrine-DM 30-2-10 MG/5ML syrup; Take 5 mLs by mouth 4 (four) times daily as needed.  Dispense: 120 mL; Refill: 0   Patient was given the opportunity to ask questions.  Patient verbalized understanding of the plan and was able to repeat key elements of the plan. Patient was given clear instructions to go to Emergency Department or return to medical center if symptoms don't improve, worsen, or new problems develop.The patient verbalized understanding.   Orders Placed This Encounter  Procedures   Culture, Group A Strep   COVID-19, Flu A+B and RSV   POCT rapid strep A     Requested Prescriptions   Signed Prescriptions Disp Refills   brompheniramine-pseudoephedrine-DM 30-2-10 MG/5ML syrup 120 mL 0    Sig: Take 5 mLs by mouth 4 (four) times daily as needed.    Return for Follow-up as needed.  Greig JINNY Chute, NP      [1]  Current Outpatient Medications on File Prior to Visit  Medication Sig Dispense Refill   AUROVELA FE 1/20 1-20 MG-MCG tablet Take 1 tablet by mouth daily.     EPINEPHrine  (EPIPEN  2-PAK) 0.3 mg/0.3 mL IJ SOAJ injection Inject 0.3 mg into the muscle as needed for anaphylaxis. 2 each 6    fexofenadine -pseudoephedrine ([name]-D ALLERGY  & CONGESTION) 180-240 MG 24 hr tablet Take 1 tablet by mouth daily. (Patient taking differently: Take 1 tablet by mouth as needed.) 30 tablet 0   fluticasone  (FLONASE ) 50 MCG/ACT nasal spray Place 1 spray into both nostrils daily. Begin by using 2 sprays in each nare daily for 3 to 5 days, then decrease to 1 spray in each nare daily. (Patient taking differently: Place 1 spray into both nostrils as needed. Begin by using 2 sprays in each nare daily for 3 to 5 days, then decrease to 1 spray in each nare daily.) 15.8 mL 2   Olopatadine  HCl (PATADAY ) 0.2 % SOLN Apply 1 drop to eye daily. (Patient taking differently: Apply 1 drop to eye as needed.) 2.5 mL 1   triamcinolone  ointment (KENALOG ) 0.1 % Apply twice daily for flare ups below neck, maximum 10 days. (Patient taking differently: Apply topically as needed. Apply twice daily for flare ups below neck, maximum 10 days.) 80 g 5   cyclobenzaprine  (FLEXERIL ) 5 MG tablet Take 1 tablet (5 mg total) by mouth 3 (three) times daily as needed. (Patient not taking: Reported on 08/10/2024) 30 tablet 0   EMGALITY 120 MG/ML SOAJ INJECT 2 SYRINGES AS A SINGLE DOSE AS DIRECTED (Patient not taking: Reported on 08/10/2024)     fluconazole  (DIFLUCAN ) 150 MG tablet Take by mouth. (Patient not taking: Reported on 08/10/2024)     metoCLOPramide  (REGLAN ) 10 MG tablet Take 1 tablet (10 mg total) by mouth every 6 (six) hours. (Patient not taking: Reported  on 08/10/2024) 30 tablet 0   norethindrone -ethinyl estradiol-iron  (JUNEL FE 1.5/30) 1.5-30 MG-MCG tablet Take 1 tablet by mouth daily. (Patient not taking: Reported on 08/10/2024)     SUMAtriptan  (IMITREX ) 25 MG tablet TAKE 1 TABLET BY MOUTH AT ONSET OF MIGRAINE. MAY REPEAT 1 TIME AT LEAST 2 HOUR LATER (Patient not taking: Reported on 08/10/2024)     No current facility-administered medications on file prior to visit.  [2]  Allergies Allergen Reactions   Pineapple Anaphylaxis   Other  Hives, Swelling and Rash    Canoa Oil   Gramineae Pollens Itching   Latex Itching   Wool Alcohol [Lanolin] Itching   "

## 2024-08-10 NOTE — Telephone Encounter (Signed)
 Pt asked to speak to therapist today due to having to cancel her PT appt due to illness.   Pt reports that dry needling helped her after last session and that her neck pain was better for several days.    She is concerned that the pain has spread to her upper traps and mid back. Pt denies any radiculopathy.   PT advised pt that this can be normal.  She has appt with a specialist and MRI scheduled.

## 2024-08-11 ENCOUNTER — Encounter: Payer: Self-pay | Admitting: Neurology

## 2024-08-11 ENCOUNTER — Ambulatory Visit: Admitting: Neurology

## 2024-08-11 VITALS — BP 120/73 | HR 82 | Ht 63.0 in | Wt 121.6 lb

## 2024-08-11 DIAGNOSIS — G43011 Migraine without aura, intractable, with status migrainosus: Secondary | ICD-10-CM

## 2024-08-11 DIAGNOSIS — M542 Cervicalgia: Secondary | ICD-10-CM

## 2024-08-11 MED ORDER — SUMATRIPTAN SUCCINATE 100 MG PO TABS: 100.0000 mg | ORAL_TABLET | ORAL | 5 refills | Status: AC | PRN

## 2024-08-11 NOTE — Patient Instructions (Addendum)
" °  Check MRI of brain with and without contrast and MRA of head and neck. We have sent a referral to [facility] for your MRI and they will call you directly to schedule your appointment. They are located at [address] [facility]. If you need to contact them directly please call [phone].  Continue EMGALITY EVERY 4 WEEKS.  If no improvement after 3 months on Emgality, contact me and we can change medication. Take SUMATRIPTAN  100MG  at earliest onset of headache.  May repeat dose once in 2 hours if needed.  Maximum 2 tablets in 24 hours. ALTERNATIVELY, if sumatriptan  ineffective, then next time take UBRELVY at earliest onset of migraine.  May repeat after 2 hours.  Maximum 2 tablets in 24 hours. Limit use of pain relievers to no more than 9 days out of the month.  These medications include acetaminophen , NSAIDs (ibuprofen/Advil/Motrin, naproxen/Aleve, triptans (Imitrex /sumatriptan ), Excedrin, and narcotics.  This will help reduce risk of rebound headaches. Be aware of common food triggers Routine exercise Stay adequately hydrated (aim for 64 oz water daily) Keep headache diary Maintain proper stress management Maintain proper sleep hygiene Do not skip meals Consider supplements:  magnesium citrate 400mg  daily, riboflavin 400mg  daily, coenzyme Q10 100mg  three times daily OR MigreLief combination pill twice daily.  "

## 2024-08-11 NOTE — Progress Notes (Signed)
 "  NEUROLOGY CONSULTATION NOTE  Holly Goodman MRN: [id number] DOB: 2002/06/294  Referring provider: Greig Chute, NP Primary care provider: Greig Chute, NP  Reason for consult:  headaches  Assessment/Plan:   Migraine without aura, with status migrainosus, intractable Cervicalgia    As she has sudden onset intractable headaches with neck pain, check MRI of brain with and without contrast to evaluate for secondary intracranial etiology and MRA of head and neck to evaluate for potential arterial etiology such as aneurysm or dissection.   Migraine prevention:  Continue Emgality - if no improvement in another 2 months, contact me and we would change management Migraine rescue:  Restart sumatriptan  at 100mg .  Alternatively, she may try samples of Ubrelvy 100mg .  She has Reglan  10mg  as needed for nausea. Continue neck stretches. Lifestyle modification: Limit use of pain relievers to no more than 9 days out of the month to prevent risk of rebound or medication-overuse headache. Diet modification/hydration/caffeine cessation Routine exercise Sleep hygiene Consider vitamins/supplements:  magnesium citrate 400mg  daily, riboflavin 400mg  daily, CoQ10 100mg  three times daily Keep headache diary Follow up 5 months.   Total time spent on today's visit was 65 minutes dedicated to this patient today, preparing to see patient, examining the patient, ordering tests and/or medications and counseling the patient, documenting clinical information in the EHR or other health record, independently interpreting results and communicating results to the patient/family, discussing treatment and goals, answering patient's questions and coordinating care.    Subjective:    Discussed the use of AI scribe software for clinical note transcription with the patient, who gave verbal consent to proceed.  History of Present Illness Holly Goodman is a 24 year old right-handed female with Sickle cell trait who presents  for headaches.  History supplemented by referring provider's note.  CTV head and cervical X-ray personally reviewed.  Headaches began abruptly in November 2025, initially presenting as severe, daily migraines. Pain was primarily frontal or unilateral, described as pounding and rated 9/10 in severity. Associated symptoms included photophobia, phonophobia, pronounced and nausea.  They were persistent throughout the day and into the following day, with no significant pain-free intervals.  By December 2025, she began experiencing daily, persistent headaches described as pressure and pounding, predominantly in the frontal, temporal, and occipital regions. Headaches are present all day, every day, and are aggravated by neck movement, particularly turning the neck. She often wakes up early due to headache pain. Nausea persists, especially at night, and phonophobia remains prominent, with loud noises exacerbating symptoms. She experiences transient episodes of tunnel vision with peripheral blurring lasting a few seconds. Photophobia is less prominent with current headaches.  Neck pain developed after the onset of headaches and is now present bilaterally. Physical therapy improved neck muscle tightness and pain, but did not alter headache frequency or severity. She previously used a muscle relaxant in November 2025 but discontinued it. Neck pain is aggravated by movement but is currently manageable without medication.  Sumatriptan  25mg  tablet was used daily at onset, providing only partial relief by dulling the pain but not eliminating it. She discontinued sumatriptan  after being advised to avoid daily use if not fully effective. Over-the-counter medications, including Excedrin and naproxen, were trialed with minimal benefit and occasional worsening of symptoms. She ceased all pain relievers in early January 2026 due to lack of efficacy and concern for overuse. Emgality injections were initiated for migraine  prevention in January 2026, with the initial two-dose regimen completed; the next monthly dose has not yet  been obtained. No other preventive medications are currently used. Metoclopramide  is available for nausea, but she has not used it, preferring to sleep off symptoms.  She cannot identify a preceding cause.  It seemed to begin after she discontinued her birth control medication.  After one month, she restarted her birth control without improvement.  She plays sports at school and does lift in the gym.  She is uncertain if she may have pulled a muscle in her neck.    No clear triggers have been identified, though straining, lifting heavy objects, and overexertion worsen symptoms. She has stopped running track and avoids heavy lifting. She maintains regular hydration, does not consume coffee, and only rarely drinks soda. Sleep is generally good, but she wakes early due to headache pain. She reports increased stress, depression, and anxiety related to chronic pain and uncertainty about her condition.  Prior workup includes a CT/CTV scan of the brain and a cervical spine x-ray, both with normal findings. An eye exam, including fundus photography, revealed 20/20 vision with mild farsightedness and no abnormalities.   Family history is notable for a father with infrequent, medication-responsive migraines. She denies any history of concussion or head trauma. She has been prescribed Zoloft  in the past but has not taken it, and has never used blood pressure medications or venlafaxine.   05/31/2024 CTV HEAD:  1. No acute intracranial abnormality. 2. No dural venous sinus thrombosis. 08/01/2024 X-RAY C-SPINE:  normal cervical lordosis, normal alignment and segmentation, well preserved disc spacing, no spondylolisthesis. No fractures.   Past NSAIDS/analgesics:  Toradol  IV, Tylenol , Excedrin, Aleve/naproxen Past abortive triptans:  sumatriptan  25mg  tab (ineffective) Past abortive ergotamine:  none Past  muscle relaxants:  Flexeril  Past anti-emetic:  Reglan  10mg , Zofran  4mg  Past antihypertensive medications:  none Past antidepressant/antipsychotic medications:  none Past anticonvulsant medications:  none Past anti-CGRP:  Emgality (tried it once but stopped because she felt that it didn't work) Past vitamins/Herbal/Supplements:  none Past antihistamines/decongestants:  [name]-D, Flonase , Xyzal , Astelin   Other past therapies:  none  Current NSAIDS/analgesics:  none Current triptans:  none Current ergotamine:  none Current anti-emetic:  none Current muscle relaxants:  none Current Antihypertensive medications:  none Current Antidepressant/antipsychotic medications:  none Current Anticonvulsant medications:  none Current anti-CGRP:  none Current Vitamins/Herbal/Supplements:  none Current Antihistamines/Decongestants:  Flonase , [name] D Other therapy:  none Birth control:  Aurovela Fe   PAST MEDICAL HISTORY: Past Medical History:  Diagnosis Date   Eczema    Lacrimal duct stenosis [id number]   Mild persistent asthma with acute exacerbation 07/07/2012   Exacerbation off and on from 06/21/12 through 07/07/12   Sickle cell trait     PAST SURGICAL HISTORY: No past surgical history on file.  MEDICATIONS: Medications Ordered Prior to Encounter[1]  ALLERGIES: Allergies[2]  FAMILY HISTORY: Family History  Problem Relation Age of Onset   Allergic rhinitis Mother    Sickle cell trait Mother    Miscarriages / Stillbirths Mother    Migraines Father    Hypertension Maternal Grandmother    Hyperlipidemia Maternal Grandmother    Diabetes Maternal Grandfather    Hypertension Maternal Grandfather    Cancer Maternal Grandfather        prostate   Hearing loss Maternal Freight forwarder exposure   Thyroid  disease Maternal Aunt    Alcohol abuse Neg Hx    Arthritis Neg Hx    Asthma Neg Hx    Birth defects Neg Hx    COPD Neg  Hx    Depression Neg Hx    Drug abuse Neg Hx     Early death Neg Hx    Heart disease Neg Hx    Kidney disease Neg Hx    Learning disabilities Neg Hx    Mental illness Neg Hx    Mental retardation Neg Hx    Stroke Neg Hx    Vision loss Neg Hx    Varicose Veins Neg Hx    Seizures Neg Hx    Sleep apnea Neg Hx     Objective:  Blood pressure 120/73, pulse 82, height 5' 3 (1.6 m), weight 121 lb 9.6 oz (55.2 kg), last menstrual period 07/10/2024, SpO2 100%. General: No acute distress.  Patient appears well-groomed.   Head:  Normocephalic/atraumatic Eyes:  fundi examined but not visualized Neck: supple, bilateral paraspinal tenderness, full range of motion Heart: regular rate and rhythm Neurological Exam: Mental status: alert and oriented to person, place, and time, speech fluent and not dysarthric, language intact. Cranial nerves: CN I: not tested CN II: pupils equal, round and reactive to light, visual fields intact CN III, IV, VI:  full range of motion, no nystagmus, no ptosis CN V: facial sensation intact. CN VII: upper and lower face symmetric CN VIII: hearing intact CN IX, X: gag intact, uvula midline CN XI: sternocleidomastoid and trapezius muscles intact CN XII: tongue midline Bulk & Tone: normal, no fasciculations. Motor:  muscle strength 5/5 throughout Sensation:  Pinprick and vibratory sensation intact. Deep Tendon Reflexes:  2+ throughout,  toes downgoing.   Finger to nose testing:  Without dysmetria.   Gait:  Normal station and stride.  Romberg negative.    Juliene Dunnings, DO  CC: Greig Chute, NP        [1]  Current Outpatient Medications on File Prior to Visit  Medication Sig Dispense Refill   AUROVELA FE 1/20 1-20 MG-MCG tablet Take 1 tablet by mouth daily.     brompheniramine-pseudoephedrine-DM 30-2-10 MG/5ML syrup Take 5 mLs by mouth 4 (four) times daily as needed. 120 mL 0   cyclobenzaprine  (FLEXERIL ) 5 MG tablet Take 1 tablet (5 mg total) by mouth 3 (three) times daily as needed. (Patient not taking:  Reported on 08/10/2024) 30 tablet 0   EMGALITY 120 MG/ML SOAJ INJECT 2 SYRINGES AS A SINGLE DOSE AS DIRECTED (Patient not taking: Reported on 08/10/2024)     EPINEPHrine  (EPIPEN  2-PAK) 0.3 mg/0.3 mL IJ SOAJ injection Inject 0.3 mg into the muscle as needed for anaphylaxis. 2 each 6   fexofenadine -pseudoephedrine ([name]-D ALLERGY  & CONGESTION) 180-240 MG 24 hr tablet Take 1 tablet by mouth daily. (Patient taking differently: Take 1 tablet by mouth as needed.) 30 tablet 0   fluconazole  (DIFLUCAN ) 150 MG tablet Take by mouth. (Patient not taking: Reported on 08/10/2024)     fluticasone  (FLONASE ) 50 MCG/ACT nasal spray Place 1 spray into both nostrils daily. Begin by using 2 sprays in each nare daily for 3 to 5 days, then decrease to 1 spray in each nare daily. (Patient taking differently: Place 1 spray into both nostrils as needed. Begin by using 2 sprays in each nare daily for 3 to 5 days, then decrease to 1 spray in each nare daily.) 15.8 mL 2   metoCLOPramide  (REGLAN ) 10 MG tablet Take 1 tablet (10 mg total) by mouth every 6 (six) hours. (Patient not taking: Reported on 08/10/2024) 30 tablet 0   norethindrone -ethinyl estradiol-iron  (JUNEL FE 1.5/30) 1.5-30 MG-MCG tablet Take 1 tablet by mouth daily. (  Patient not taking: Reported on 08/10/2024)     Olopatadine  HCl (PATADAY ) 0.2 % SOLN Apply 1 drop to eye daily. (Patient taking differently: Apply 1 drop to eye as needed.) 2.5 mL 1   SUMAtriptan  (IMITREX ) 25 MG tablet TAKE 1 TABLET BY MOUTH AT ONSET OF MIGRAINE. MAY REPEAT 1 TIME AT LEAST 2 HOUR LATER (Patient not taking: Reported on 08/10/2024)     triamcinolone  ointment (KENALOG ) 0.1 % Apply twice daily for flare ups below neck, maximum 10 days. (Patient taking differently: Apply topically as needed. Apply twice daily for flare ups below neck, maximum 10 days.) 80 g 5   No current facility-administered medications on file prior to visit.  [2]  Allergies Allergen Reactions   Pineapple Anaphylaxis   Other Hives,  Swelling and Rash    Canoa Oil   Gramineae Pollens Itching   Latex Itching   Wool Alcohol [Lanolin] Itching   "

## 2024-08-17 ENCOUNTER — Ambulatory Visit: Admitting: Physical Therapy

## 2024-08-22 ENCOUNTER — Ambulatory Visit

## 2024-08-24 ENCOUNTER — Ambulatory Visit: Admitting: Physical Therapy

## 2024-08-31 ENCOUNTER — Ambulatory Visit: Admitting: Physical Therapy

## 2024-09-06 ENCOUNTER — Ambulatory Visit: Admitting: Orthopedic Surgery

## 2024-09-07 ENCOUNTER — Ambulatory Visit: Admitting: Physical Therapy

## 2024-09-14 ENCOUNTER — Ambulatory Visit

## 2024-09-15 ENCOUNTER — Other Ambulatory Visit

## 2024-09-21 ENCOUNTER — Ambulatory Visit

## 2024-09-28 ENCOUNTER — Ambulatory Visit: Admitting: Physical Therapy

## 2024-10-05 ENCOUNTER — Ambulatory Visit

## 2024-10-12 ENCOUNTER — Ambulatory Visit: Admitting: Neurology

## 2024-10-12 ENCOUNTER — Ambulatory Visit: Admitting: Physical Therapy

## 2024-11-13 ENCOUNTER — Institutional Professional Consult (permissible substitution) (INDEPENDENT_AMBULATORY_CARE_PROVIDER_SITE_OTHER)

## 2024-11-20 ENCOUNTER — Ambulatory Visit: Payer: Self-pay | Admitting: Neurology

## 2025-02-27 ENCOUNTER — Ambulatory Visit: Payer: Self-pay | Admitting: Neurology

## 2025-03-13 ENCOUNTER — Ambulatory Visit: Admitting: Adult Health
